# Patient Record
Sex: Male | Born: 1985 | Race: Black or African American | Hispanic: No | Marital: Married | Smoking: Never smoker
Health system: Southern US, Community
[De-identification: ages and names within clinical notes are randomized; demographics above are authoritative.]

## PROBLEM LIST (undated history)

## (undated) DIAGNOSIS — K469 Unspecified abdominal hernia without obstruction or gangrene: Secondary | ICD-10-CM

## (undated) DIAGNOSIS — I517 Cardiomegaly: Secondary | ICD-10-CM

## (undated) DIAGNOSIS — I10 Essential (primary) hypertension: Secondary | ICD-10-CM

## (undated) HISTORY — DX: Essential (primary) hypertension: I10

---

## 2007-05-31 ENCOUNTER — Emergency Department (HOSPITAL_COMMUNITY): Admission: EM | Admit: 2007-05-31 | Discharge: 2007-05-31 | Payer: Self-pay | Admitting: Emergency Medicine

## 2007-06-01 ENCOUNTER — Emergency Department (HOSPITAL_COMMUNITY): Admission: EM | Admit: 2007-06-01 | Discharge: 2007-06-01 | Payer: Self-pay | Admitting: Family Medicine

## 2007-06-10 ENCOUNTER — Emergency Department (HOSPITAL_COMMUNITY): Admission: EM | Admit: 2007-06-10 | Discharge: 2007-06-11 | Payer: Self-pay | Admitting: Emergency Medicine

## 2009-12-08 ENCOUNTER — Emergency Department (HOSPITAL_COMMUNITY): Admission: EM | Admit: 2009-12-08 | Discharge: 2009-12-08 | Payer: Self-pay | Admitting: Emergency Medicine

## 2010-04-16 ENCOUNTER — Emergency Department (HOSPITAL_COMMUNITY): Admission: EM | Admit: 2010-04-16 | Discharge: 2010-04-16 | Payer: Self-pay | Admitting: Emergency Medicine

## 2013-03-06 ENCOUNTER — Ambulatory Visit (INDEPENDENT_AMBULATORY_CARE_PROVIDER_SITE_OTHER): Payer: BC Managed Care – PPO | Admitting: Internal Medicine

## 2013-03-06 ENCOUNTER — Ambulatory Visit: Payer: BC Managed Care – PPO

## 2013-03-06 VITALS — BP 120/80 | HR 72 | Temp 98.6°F | Resp 18 | Ht 71.0 in | Wt 221.0 lb

## 2013-03-06 DIAGNOSIS — S93421A Sprain of deltoid ligament of right ankle, initial encounter: Secondary | ICD-10-CM

## 2013-03-06 DIAGNOSIS — M25571 Pain in right ankle and joints of right foot: Secondary | ICD-10-CM

## 2013-03-06 DIAGNOSIS — M25579 Pain in unspecified ankle and joints of unspecified foot: Secondary | ICD-10-CM

## 2013-03-06 DIAGNOSIS — I10 Essential (primary) hypertension: Secondary | ICD-10-CM

## 2013-03-06 DIAGNOSIS — S93429A Sprain of deltoid ligament of unspecified ankle, initial encounter: Secondary | ICD-10-CM

## 2013-03-06 NOTE — Progress Notes (Signed)
  Subjective:    Patient ID: Jake Hoffman, male    DOB: 05/14/1986, 27 y.o.   MRN: [id number]  HPI Was lifting weights with his legs in the gym this morning he felt a pop in his right ankle Has had increasing pain in the medial aspect as the day goes on Now hurts to walk No prior injuries  On HCTZ/lisinopril for hypertension  Review of Systems Noncontributory    Objective:   Physical Exam BP 120/80  Pulse 72  Temp(Src) 98.6 F (37 C) (Oral)  Resp 18  Ht 5\' 11"  (1.803 m)  Wt 221 lb (100.245 kg)  BMI 30.84 kg/m2  SpO2 99% No acute distress Alert and oriented Right ankle has minimal swelling over the medial aspect of the joint He is very tender to palpation in the posterior deltoid and the tip of the tibia He has pain with range of motion including drawer inversion and eversion  [facility] reading (PRIMARY) by  Dr.Uzoma Vivona=no fracture       Assessment & Plan:  Ankle sprain medial malleolus Pain ankle  [employer] activity as tolerated Note for work (moving company)

## 2013-05-05 ENCOUNTER — Telehealth: Payer: Self-pay | Admitting: Family

## 2013-05-05 ENCOUNTER — Encounter: Payer: Self-pay | Admitting: Family

## 2013-05-05 ENCOUNTER — Ambulatory Visit (INDEPENDENT_AMBULATORY_CARE_PROVIDER_SITE_OTHER): Payer: BC Managed Care – PPO | Admitting: Family

## 2013-05-05 VITALS — BP 118/70 | HR 84 | Ht 71.0 in | Wt 220.0 lb

## 2013-05-05 DIAGNOSIS — M25511 Pain in right shoulder: Secondary | ICD-10-CM

## 2013-05-05 DIAGNOSIS — I10 Essential (primary) hypertension: Secondary | ICD-10-CM

## 2013-05-05 DIAGNOSIS — Z23 Encounter for immunization: Secondary | ICD-10-CM

## 2013-05-05 DIAGNOSIS — M545 Low back pain: Secondary | ICD-10-CM

## 2013-05-05 DIAGNOSIS — M25519 Pain in unspecified shoulder: Secondary | ICD-10-CM

## 2013-05-05 LAB — CBC WITH DIFFERENTIAL/PLATELET
Basophils Relative: 0.5 % (ref 0.0–3.0)
Hemoglobin: 15.7 g/dL (ref 13.0–17.0)
Lymphs Abs: 2.3 10*3/uL (ref 0.7–4.0)
MCHC: 34.2 g/dL (ref 30.0–36.0)
MCV: 97.5 fl (ref 78.0–100.0)
Neutro Abs: 2.3 10*3/uL (ref 1.4–7.7)
Platelets: 268 10*3/uL (ref 150.0–400.0)
RBC: 4.7 Mil/uL (ref 4.22–5.81)
RDW: 11.9 % (ref 11.5–14.6)
WBC: 5.1 10*3/uL (ref 4.5–10.5)

## 2013-05-05 LAB — LIPID PANEL
Cholesterol: 174 mg/dL (ref 0–200)
Triglycerides: 32 mg/dL (ref 0.0–149.0)

## 2013-05-05 LAB — HEPATIC FUNCTION PANEL
AST: 34 U/L (ref 0–37)
Alkaline Phosphatase: 53 U/L (ref 39–117)
Total Bilirubin: 0.9 mg/dL (ref 0.3–1.2)

## 2013-05-05 LAB — BASIC METABOLIC PANEL
BUN: 12 mg/dL (ref 6–23)
CO2: 28 mEq/L (ref 19–32)
Calcium: 9.4 mg/dL (ref 8.4–10.5)
Creatinine, Ser: 1.2 mg/dL (ref 0.4–1.5)
GFR: 94.81 mL/min (ref >60.00)

## 2013-05-05 LAB — POCT URINALYSIS DIPSTICK: Urobilinogen, UA: 0.2

## 2013-05-05 MED ORDER — MELOXICAM 15 MG PO TABS: 15.0000 mg | ORAL_TABLET | Freq: Every day | ORAL | Status: DC

## 2013-05-05 MED ORDER — LISINOPRIL-HYDROCHLOROTHIAZIDE 10-12.5 MG PO TABS: 1.0000 | ORAL_TABLET | Freq: Every day | ORAL | Status: DC

## 2013-05-05 NOTE — Telephone Encounter (Signed)
Rx resent for 90 day supply. 

## 2013-05-05 NOTE — Telephone Encounter (Signed)
Pharmacy called and stated that the pt's insurance requires a 3 month supply of his lisinopril-hydrochlorothiazide (PRINZIDE,ZESTORETIC) 10-12.5 MG per tablet. Please assist.

## 2013-05-05 NOTE — Patient Instructions (Addendum)
Rotator Cuff Tendonitis  The rotator cuff is the collection of all the muscles and tendons (the supraspinatus, infraspinatus, subscapularis, and teres minor muscles and their tendons) that help your shoulder stay in place. This unit holds the head of the upper arm bone (humerus) in the cup (fossa) of the shoulder blade (scapula). Basically, it connects the arm to the shoulder. Tendinitis is a swelling and irritation of the tissue, called cord like structures (tendons) that connect muscle to bone. It usually is caused by overusing the joint involved. When the tissue surrounding a tendon (the synovium) becomes inflamed, it is called tenosynovitis. This also is often the result of overuse in people whose jobs require repetitive (over and over again) types of motion. HOME CARE INSTRUCTIONS   Use a sling or splint for as long as directed by your caregiver until the pain decreases.  Apply ice to the injury for 15-20 minutes, 3-4 times per day. Put the ice in a plastic bag and place a towel between the bag of ice and your skin.  Try to avoid use other than gentle range of motion while your shoulder is painful. Use and exercise only as directed by your caregiver. Stop exercises or range of motion if pain or discomfort increases, unless directed otherwise by your caregiver.  Only take over-the-counter or prescription medicines for pain, discomfort, or fever as directed by your caregiver.  If you were give a shoulder sling and straps (immobilizer), do not remove it except as directed, or until you see a caregiver for a follow-up examination. If you need to remove it, move your arm as little as possible or as directed.  You may want to sleep on several pillows at night to lessen swelling and pain. SEEK IMMEDIATE MEDICAL CARE IF:   Pain in your shoulder increases or new pain develops in your arm, hand, or fingers and is not relieved with medications.  You develop new, unexplained symptoms, especially  increased numbness in the hands or loss of strength, or you develop any worsening of the problems which brought you in for care.  Your arm, hand, or fingers are numb or tingling.  Your arm, hand, or fingers are swollen, painful, or turn white or blue. Document Released: 10/17/2003 Document Revised: 10/19/2011 Document Reviewed: 05/24/2008 Exit[employer] Patient Information 2014 [employer].  

## 2013-05-05 NOTE — Progress Notes (Signed)
Subjective:    Patient ID: Jake Hoffman, male    DOB: [birth year], 27 y.o.   MRN: [id number]  HPI  A 27 year old [place] American male, new patient to the practice and to be established. He has a history of hypertension and currently takes lisinopril HCT once daily and tolerates it well. Mother and father both have hypertension.  Patient has concerns of right shoulder pain that he rates at 10 out of 10, waxing and waning. He describes it as an achy sharp pain that typically last 3-4 weeks to resolve anti-inflammatory medications. However, after his most recent flare, it took about 2 months actually heal. He seen orthopedics in the past has had massage therapy and physical therapy they have all been ineffective. He has a known rotator cuff tear. He is a Scientist, forensic by trade.  Also complains of low back pain that he rates as 5-6/10, worse with movement when it flares. Aleve helps.  Review of Systems  Constitutional: Negative.   HENT: Negative.   Respiratory: Negative.   Cardiovascular: Negative.   Gastrointestinal: Negative.   Musculoskeletal: Positive for back pain and arthralgias.       Right shoulder and back pain  Skin: Negative.   Neurological: Negative.  Negative for weakness and light-headedness.  Hematological: Negative.   Psychiatric/Behavioral: Negative.    Past Medical History  Diagnosis Date  . Hypertension     History   Social History  . Marital Status: Single    Spouse Name: N/A    Number of Children: N/A  . Years of Education: N/A   Occupational History  . Not on file.   Social History Main Topics  . Smoking status: Never Smoker   . Smokeless tobacco: Not on file  . Alcohol Use: Yes  . Drug Use: No  . Sexual Activity: Not on file   Other Topics Concern  . Not on file   Social History Narrative  . No narrative on file    History reviewed. No pertinent past surgical history.  Family History  Problem Relation Age of Onset  . Hyperlipidemia Mother    . Hypertension Mother   . Arthritis Mother   . Hypertension Father     No Known Allergies  No current outpatient prescriptions on file prior to visit.   No current facility-administered medications on file prior to visit.    BP 118/70  Pulse 84  Ht 5\' 11"  (1.803 m)  Wt 220 lb (99.791 kg)  BMI 30.7 kg/m2chart    Objective:   Physical Exam  Constitutional: He is oriented to person, place, and time. He appears well-developed and well-nourished.  HENT:  Right Ear: External ear normal.  Left Ear: External ear normal.  Nose: Nose normal.  Mouth/Throat: Oropharynx is clear and moist.  Neck: Normal range of motion. Neck supple. No thyromegaly present.  Cardiovascular: Normal rate, regular rhythm and normal heart sounds.   Pulmonary/Chest: Effort normal and breath sounds normal.  Abdominal: Soft. Bowel sounds are normal.  Musculoskeletal: He exhibits tenderness. He exhibits no edema.  Right shoulder: Tender to palpation of the anterior right shoulder. Positive empty can test. Pain with forced flexion and extension. Pulses radial 2/2  Neurological: He is alert and oriented to person, place, and time.  Skin: Skin is warm and dry.  Psychiatric: He has a normal mood and affect.          Assessment & Plan:  Assessment: 1. Hypertension 2. Right shoulder pain 3. Rotator cuff tear 4. Low back  pain  Plan: Meloxicam 15 mg once daily with food. Continue lisinopril HCT. Return for complete physical exam. Patient does not want to explore any surgical intervention at this time. I have advised that if he comes open to that idea to refer him back to orthopedics. Consider joint injections. Continue current medications.

## 2013-05-16 ENCOUNTER — Encounter: Payer: BC Managed Care – PPO | Admitting: Family

## 2013-05-22 ENCOUNTER — Encounter: Payer: BC Managed Care – PPO | Admitting: Family

## 2013-07-03 ENCOUNTER — Ambulatory Visit: Payer: BC Managed Care – PPO | Admitting: Family

## 2013-07-11 ENCOUNTER — Telehealth: Payer: Self-pay | Admitting: Family

## 2013-07-11 ENCOUNTER — Ambulatory Visit (INDEPENDENT_AMBULATORY_CARE_PROVIDER_SITE_OTHER): Payer: BC Managed Care – PPO | Admitting: Family

## 2013-07-11 ENCOUNTER — Encounter: Payer: Self-pay | Admitting: Family

## 2013-07-11 VITALS — BP 142/100 | HR 80 | Wt 219.0 lb

## 2013-07-11 DIAGNOSIS — R9431 Abnormal electrocardiogram [ECG] [EKG]: Secondary | ICD-10-CM

## 2013-07-11 DIAGNOSIS — I10 Essential (primary) hypertension: Secondary | ICD-10-CM

## 2013-07-11 MED ORDER — LISINOPRIL-HYDROCHLOROTHIAZIDE 10-12.5 MG PO TABS: 1.0000 | ORAL_TABLET | Freq: Every day | ORAL | Status: DC

## 2013-07-11 NOTE — Patient Instructions (Signed)
1. See Cardiology

## 2013-07-11 NOTE — Telephone Encounter (Signed)
Pt would like refill of lisinopril-hydrochlorothiazide (PRINZIDE,ZESTORETIC) 10-12.5 MG per tablet  90 day  1 x day [facility]

## 2013-07-11 NOTE — Progress Notes (Signed)
   Subjective:    Patient ID: Jake Hoffman, male    DOB: May 19, 1986, 27 y.o.   MRN: [id number]  HPI 27 year old African American male, nonsmoker, with a history of hypertension is in today after his surgery was canceled by orthopedic surgery. He was scheduled to have a rotator cuff repair. He was found to have an abnormal EKG and elevated blood pressure. Patient admits that he had not taken his blood pressure in approximately 3 days. Has a family history significant for cardiovascular disease in his mother and father. He denies any lightheadedness, dizziness, chest pain, palpitations, shortness of breath or edema. EKG showed nonspecific ST changes and borderline LVH.   Review of Systems  Constitutional: Negative.   HENT: Negative.   Respiratory: Negative.   Cardiovascular: Negative.   Gastrointestinal: Negative.   Endocrine: Negative.   Genitourinary: Negative.   Musculoskeletal: Negative.   Skin: Negative.   Allergic/Immunologic: Negative.   Neurological: Negative.   Hematological: Negative.   Psychiatric/Behavioral: Negative.    Past Medical History  Diagnosis Date  . Hypertension     History   Social History  . Marital Status: Single    Spouse Name: N/A    Number of Children: N/A  . Years of Education: N/A   Occupational History  . Not on file.   Social History Main Topics  . Smoking status: Never Smoker   . Smokeless tobacco: Not on file  . Alcohol Use: Yes  . Drug Use: No  . Sexual Activity: Not on file   Other Topics Concern  . Not on file   Social History Narrative  . No narrative on file    History reviewed. No pertinent past surgical history.  Family History  Problem Relation Age of Onset  . Hyperlipidemia Mother   . Hypertension Mother   . Arthritis Mother   . Hypertension Father     No Known Allergies  Current Outpatient Prescriptions on File Prior to Visit  Medication Sig Dispense Refill  . lisinopril-hydrochlorothiazide  (PRINZIDE,ZESTORETIC) 10-12.5 MG per tablet Take 1 tablet by mouth daily.  90 tablet  1  . meloxicam (MOBIC) 15 MG tablet Take 1 tablet (15 mg total) by mouth daily.  30 tablet  5  . Omega-3 Fatty Acids (FISH OIL) 500 MG CAPS Take by mouth.       No current facility-administered medications on file prior to visit.    BP 142/100  Pulse 80  Wt 219 lb (99.338 kg)chart    Objective:   Physical Exam  Constitutional: He is oriented to person, place, and time. He appears well-developed and well-nourished.  HENT:  Right Ear: External ear normal.  Left Ear: External ear normal.  Nose: Nose normal.  Mouth/Throat: Oropharynx is clear and moist.  Neck: Normal range of motion. Neck supple.  Cardiovascular: Normal rate, regular rhythm and normal heart sounds.   Pulmonary/Chest: Effort normal and breath sounds normal.  Abdominal: Soft. Bowel sounds are normal.  Musculoskeletal: Normal range of motion.  Neurological: He is alert and oriented to person, place, and time.  Skin: Skin is warm and dry.  Psychiatric: He has a normal mood and affect.          Assessment & Plan:  Assessment: 1. Hypertension 2. Abnormal EKG  Plan: Referred to cardiology. Encouraged medication compliance. Encouraged healthy diet.

## 2013-07-11 NOTE — Telephone Encounter (Signed)
The surgical center scheduled his cardiology appt for thurs. Pt unsure who this is with. So no need to do this referral.

## 2013-07-11 NOTE — Telephone Encounter (Signed)
Done and pt aware

## 2013-07-13 ENCOUNTER — Ambulatory Visit (INDEPENDENT_AMBULATORY_CARE_PROVIDER_SITE_OTHER): Payer: BC Managed Care – PPO | Admitting: Cardiovascular Disease

## 2013-07-13 ENCOUNTER — Encounter: Payer: Self-pay | Admitting: Cardiovascular Disease

## 2013-07-13 VITALS — BP 118/80 | HR 68 | Ht 71.0 in | Wt 218.1 lb

## 2013-07-13 DIAGNOSIS — I10 Essential (primary) hypertension: Secondary | ICD-10-CM

## 2013-07-13 DIAGNOSIS — R9431 Abnormal electrocardiogram [ECG] [EKG]: Secondary | ICD-10-CM

## 2013-07-13 NOTE — Progress Notes (Signed)
     Jake Hoffman Date of Birth  Aug 20, 1985       [place] Jake Hoffman    [facility] [address]  [address] [place], [place]  [address]   [place], [place]  [address] [phone]     [phone]   Fax  [phone]    Fax [phone]  Problem List: 1. Hypertension 2. Abnormal EKG  History of Present Illness:  Jake Hoffman is a 27 yo BM with a history of hypertension. He was recently scheduled have rotator cuff surgery. The surgery was canceled when he presented to surgery and was found to have an abnormal EKG. His blood pressure was also elevated at the time.  Jake Hoffman is very healthy.  he currently works as a Civil Service fast streamer. He also works out at [employer] quite often.  He denies any episodes of chest pain or shortness breath. No episodes of syncope.  There is a strong family history of hypertension. There is no family history of sudden cardiac death.  Current Outpatient Prescriptions on File Prior to Visit  Medication Sig Dispense Refill  . lisinopril-hydrochlorothiazide (PRINZIDE,ZESTORETIC) 10-12.5 MG per tablet Take 1 tablet by mouth daily.  90 tablet  0  . Omega-3 Fatty Acids (FISH OIL) 500 MG CAPS Take by mouth.       No current facility-administered medications on file prior to visit.    No Known Allergies  Past Medical History  Diagnosis Date  . Hypertension     No past surgical history on file.  History  Smoking status  . Never Smoker   Smokeless tobacco  . Not on file    History  Alcohol Use  . Yes    Family History  Problem Relation Age of Onset  . Hyperlipidemia Mother   . Hypertension Mother   . Arthritis Mother   . Hypertension Father     Reviw of Systems:  Reviewed in the HPI.  All other systems are negative.  Physical Exam: Blood pressure 118/80, pulse 68, height 5\' 11"  (1.803 m), weight 218 lb 1.9 oz (98.939 kg). General: Well developed, well nourished, in no acute distress.  Head:  Normocephalic, atraumatic, sclera non-icteric, mucus membranes are moist,   Neck: Supple. Carotids are 2 + without bruits. No JVD   Lungs: Clear   Heart: RR, no murmur  Abdomen: Soft, non-tender, non-distended with normal bowel sounds.  Msk:  Strength and tone are normal   Extremities: No clubbing or cyanosis. No edema.  Distal pedal pulses are 2+ and equal    Neuro: CN II - XII intact.  Alert and oriented X 3.   Psych:  Normal   ECG: Dec. 2, 2014;  NSR at 79.  TWI in the inferior and lateral leads.  No old tracing to compare.   Assessment / Plan:

## 2013-07-13 NOTE — Patient Instructions (Signed)
Your physician has requested that you have an echocardiogram please see if it can be done tomorrow/ pt has surgery. Echocardiography is a painless test that uses sound waves to create images of your heart. It provides your doctor with information about the size and shape of your heart and how well your heart's chambers and valves are working. This procedure takes approximately one hour. There are no restrictions for this procedure.   Your physician recommends that you schedule a follow-up appointment in: as needed basis

## 2013-07-13 NOTE — Assessment & Plan Note (Signed)
Jake Hoffman presents for further evaluation and preoperative evaluation because of his hypertension and abnormal EKG.  He is a very healthy young gentleman. He has a long history of hypertension. He admits to not taking his medications all the time. When he showed up for surgery, his blood pressure was elevated and he was noted to have an abnormal EKG.  I suspect that he has left ventricular hypertrophy as a cause for his T-wave inversions. He's not had any episodes of angina and works out vigorously a regular basis. 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  His blood pressure is normal today.  His cardiac exam is normal. There is no significant murmur. He has no history of sudden cardiac death.  We'll get an echocardiogram for further evaluation of his left ventricular size and function.  I assume that we'll find left ventricular hypertrophy. If the echocardiogram is otherwise normal and he should be at low risk for his upcoming shoulder surgery.  I'll see him on an as-needed basis.

## 2013-07-14 ENCOUNTER — Encounter: Payer: Self-pay | Admitting: Cardiovascular Disease

## 2013-07-14 ENCOUNTER — Ambulatory Visit (HOSPITAL_COMMUNITY): Payer: BC Managed Care – PPO | Attending: Cardiovascular Disease | Admitting: Radiology

## 2013-07-14 DIAGNOSIS — R9431 Abnormal electrocardiogram [ECG] [EKG]: Secondary | ICD-10-CM | POA: Insufficient documentation

## 2013-07-14 DIAGNOSIS — I10 Essential (primary) hypertension: Secondary | ICD-10-CM

## 2013-07-14 NOTE — Progress Notes (Signed)
Echocardiogram performed.  

## 2013-07-17 ENCOUNTER — Telehealth: Payer: Self-pay | Admitting: *Deleted

## 2013-07-17 NOTE — Telephone Encounter (Signed)
Pt verbalized understanding.

## 2013-07-17 NOTE — Telephone Encounter (Signed)
Message copied by Antony Odea on Mon Jul 17, 2013  1:46 PM ------      Message from: Vesta Mixer      Created: Sun Jul 16, 2013  6:32 AM       The echo shows normal LV function.  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  He is at low risk for his upcoming shoulder surgery. ------

## 2013-07-18 ENCOUNTER — Telehealth: Payer: Self-pay

## 2013-07-18 MED ORDER — METHYLPREDNISOLONE 4 MG PO KIT: PACK | ORAL | Status: DC

## 2013-07-18 NOTE — Telephone Encounter (Signed)
Pt c/o cough, congestion, yellow/green mucus. Has been exposed to bronchitis by 2 people.  Per Oran Rein, Medrol dosepak to pharmacy. If sxs worsen or persist pt needs to be seen

## 2013-07-19 ENCOUNTER — Ambulatory Visit (INDEPENDENT_AMBULATORY_CARE_PROVIDER_SITE_OTHER): Payer: BC Managed Care – PPO | Admitting: Family

## 2013-07-19 ENCOUNTER — Encounter: Payer: Self-pay | Admitting: Family

## 2013-07-19 VITALS — BP 124/84 | Temp 98.1°F | Wt 222.0 lb

## 2013-07-19 DIAGNOSIS — R05 Cough: Secondary | ICD-10-CM

## 2013-07-19 DIAGNOSIS — J069 Acute upper respiratory infection, unspecified: Secondary | ICD-10-CM

## 2013-07-19 NOTE — Progress Notes (Signed)
Pre visit review using our clinic review tool, if applicable. No additional management support is needed unless otherwise documented below in the visit note. 

## 2013-07-19 NOTE — Progress Notes (Signed)
   Subjective:    Patient ID: Jake Hoffman, male    DOB: 08-27-1985, 27 y.o.   MRN: [id number]  HPI 27 year old African American male, nonsmoker presents today with complaints of cough, chest congestion x3 days. Yesterday we called in prednisone for him and today he feels better. However, he wants to come in just to be checked. Is taking Mucinex and Delsym over-the-counter that has helped. His son has been sick as well.   Review of Systems  Constitutional: Negative.   HENT: Positive for congestion.   Respiratory: Positive for cough.   Cardiovascular: Negative.   Skin: Negative.   Allergic/Immunologic: Negative.   Neurological: Negative.   Psychiatric/Behavioral: Negative.    Past Medical History  Diagnosis Date  . Hypertension     History   Social History  . Marital Status: Single    Spouse Name: N/A    Number of Children: N/A  . Years of Education: N/A   Occupational History  . Not on file.   Social History Main Topics  . Smoking status: Never Smoker   . Smokeless tobacco: Not on file  . Alcohol Use: Yes  . Drug Use: No  . Sexual Activity: Not on file   Other Topics Concern  . Not on file   Social History Narrative  . No narrative on file    History reviewed. No pertinent past surgical history.  Family History  Problem Relation Age of Onset  . Hyperlipidemia Mother   . Hypertension Mother   . Arthritis Mother   . Hypertension Father     No Known Allergies  Current Outpatient Prescriptions on File Prior to Visit  Medication Sig Dispense Refill  . lisinopril-hydrochlorothiazide (PRINZIDE,ZESTORETIC) 10-12.5 MG per tablet Take 1 tablet by mouth daily.  90 tablet  0  . methylPREDNISolone (MEDROL DOSEPAK) 4 MG tablet follow package directions  21 tablet  0  . Omega-3 Fatty Acids (FISH OIL) 500 MG CAPS Take by mouth.       No current facility-administered medications on file prior to visit.    BP 124/84  Temp(Src) 98.1 F (36.7 C) (Oral)  Wt 222  lb (100.699 kg)chart    Objective:   Physical Exam  Constitutional: He is oriented to person, place, and time. He appears well-developed and well-nourished.  HENT:  Right Ear: External ear normal.  Left Ear: External ear normal.  Nose: Nose normal.  Mouth/Throat: Oropharynx is clear and moist.  Neck: Normal range of motion. Neck supple.  Cardiovascular: Normal rate, regular rhythm and normal heart sounds.   Pulmonary/Chest: Effort normal and breath sounds normal. He has no wheezes.  Neurological: He is alert and oriented to person, place, and time.  Skin: Skin is warm and dry.  Psychiatric: He has a normal mood and affect.          Assessment & Plan:  Assessment: 1. Upper respiratory infection 2. Cough  Plan: Continue Medrol Dosepak as directed. Continue over-the-counter symptomatic treatment for relief. Patient by the office if symptoms worsen or persist. Recheck as scheduled, and as needed.

## 2013-07-19 NOTE — Progress Notes (Signed)
   Subjective:    Patient ID: Jake Hoffman, male    DOB: [birth year], 27 y.o.   MRN: [id number]  HPI 27 year old AA male, nonsmoker, presenting with cough x 3 weeks.  Has tried several OTC medications that include Mucinex, Delsyn, Aleve, Tessalon pearls, Flonase, and Hycodan syrup.  Received prescription yesterday for prednisone.  Complains of runny nose, nasal congestion, cough, and pressure in ears.      Review of Systems  Constitutional: Negative.   HENT: Positive for congestion, ear pain and postnasal drip.        Patient complains of nasal congestion, runny nose, and pressure in ears  Eyes: Negative.   Respiratory: Positive for cough.        Patient complains of cough  Cardiovascular: Negative.   Gastrointestinal: Negative.   Endocrine: Negative.   Genitourinary: Negative.   Musculoskeletal: Negative.   Skin: Negative.   Allergic/Immunologic: Negative.   Neurological: Negative.   Hematological: Negative.   Psychiatric/Behavioral: Negative.        Objective:   Physical Exam  Constitutional: He is oriented to person, place, and time. He appears well-developed and well-nourished.  HENT:  Right Ear: External ear normal.  Left Ear: External ear normal.  Patient has white fluid noted in both ears. Cough and nasal congestion noted.  Cardiovascular: Normal rate and normal heart sounds.   Pulmonary/Chest: Effort normal and breath sounds normal.  Neurological: He is alert and oriented to person, place, and time.  Skin: Skin is warm and dry.          Assessment & Plan:  Assessment  Upper Respiratory Infection  Plan Continue on Medrol Dosepak and Delsyn.  Drink plenty of fluids.  Practice good hand washing.  Call office if symptoms persist or worsen.

## 2013-07-20 ENCOUNTER — Other Ambulatory Visit (HOSPITAL_COMMUNITY): Payer: BC Managed Care – PPO

## 2013-07-20 NOTE — Patient Instructions (Signed)

## 2013-08-08 ENCOUNTER — Ambulatory Visit: Payer: BC Managed Care – PPO | Admitting: Family

## 2013-09-18 ENCOUNTER — Encounter: Payer: Self-pay | Admitting: Family

## 2013-09-18 ENCOUNTER — Ambulatory Visit (INDEPENDENT_AMBULATORY_CARE_PROVIDER_SITE_OTHER): Payer: BC Managed Care – PPO | Admitting: Family

## 2013-09-18 VITALS — BP 122/62 | HR 90 | Ht 71.0 in | Wt 216.0 lb

## 2013-09-18 DIAGNOSIS — Z0000 Encounter for general adult medical examination without abnormal findings: Secondary | ICD-10-CM

## 2013-09-18 DIAGNOSIS — I10 Essential (primary) hypertension: Secondary | ICD-10-CM

## 2013-09-18 MED ORDER — LISINOPRIL-HYDROCHLOROTHIAZIDE 10-12.5 MG PO TABS: 1.0000 | ORAL_TABLET | Freq: Every day | ORAL | Status: DC

## 2013-09-18 NOTE — Progress Notes (Signed)
   Subjective:    Patient ID: Jake Hoffman, male    DOB: Aug 16, 1985, 28 y.o.   MRN: [id number]  HPI. 28 year old AA male, nonsmoker, presents for annual physical examination. All immunizations and health maintenance protocols were reviewed with the patient and they are up to date with these protocols. Goals were established with regard to weight loss exercise diet in compliance with medications.  He misses several doses during the week. He runs two miles a day.    Review of Systems  Constitutional: Negative.   HENT: Negative.   Eyes: Negative.   Respiratory: Negative.   Cardiovascular: Negative.   Gastrointestinal: Negative.   Endocrine: Negative.   Genitourinary: Negative.   Musculoskeletal: Negative.   Skin: Negative.   Allergic/Immunologic: Negative.   Neurological: Negative.   Hematological: Negative.   Psychiatric/Behavioral: Negative.    Past Medical History  Diagnosis Date  . Hypertension     History   Social History  . Marital Status: Single    Spouse Name: N/A    Number of Children: N/A  . Years of Education: N/A   Occupational History  . Not on file.   Social History Main Topics  . Smoking status: Never Smoker   . Smokeless tobacco: Not on file  . Alcohol Use: Yes  . Drug Use: No  . Sexual Activity: Not on file   Other Topics Concern  . Not on file   Social History Narrative  . No narrative on file    History reviewed. No pertinent past surgical history.  Family History  Problem Relation Age of Onset  . Hyperlipidemia Mother   . Hypertension Mother   . Arthritis Mother   . Hypertension Father     No Known Allergies  Current Outpatient Prescriptions on File Prior to Visit  Medication Sig Dispense Refill  . Omega-3 Fatty Acids (FISH OIL) 500 MG CAPS Take by mouth.       No current facility-administered medications on file prior to visit.    BP 122/62  Pulse 90  Ht 5\' 11"  (1.803 m)  Wt 216 lb (97.977 kg)  BMI 30.14  kg/m2chart     Objective:   Physical Exam  Constitutional: He is oriented to person, place, and time. He appears well-developed and well-nourished.  HENT:  Head: Normocephalic.  Right Ear: External ear normal.  Left Ear: External ear normal.  Eyes: Pupils are equal, round, and reactive to light.  Neck: Normal range of motion. Neck supple.  Cardiovascular: Normal rate, regular rhythm and normal heart sounds.   Pulmonary/Chest: Effort normal and breath sounds normal.  Abdominal: Soft. Bowel sounds are normal.  Musculoskeletal: Normal range of motion.  Neurological: He is alert and oriented to person, place, and time.  Skin: Skin is warm and dry.  Psychiatric: He has a normal mood and affect.          Assessment & Plan:  Assessment 1. Annual Physical Examination 2. Hypertension  Plan 1. Continue current medication. 2. Encourage low sodium diet.. 3. Educate about the importanceof taking his blood pressure medication as scheduled. 4. Refill on medication.

## 2013-09-18 NOTE — Patient Instructions (Signed)

## 2013-09-18 NOTE — Progress Notes (Signed)
Pre visit review using our clinic review tool, if applicable. No additional management support is needed unless otherwise documented below in the visit note. 

## 2013-09-19 ENCOUNTER — Telehealth: Payer: Self-pay | Admitting: Family

## 2013-09-19 NOTE — Telephone Encounter (Signed)
Relevant patient education assigned to patient using Emmi. ° °

## 2013-10-20 ENCOUNTER — Emergency Department (HOSPITAL_COMMUNITY)
Admission: EM | Admit: 2013-10-20 | Discharge: 2013-10-20 | Disposition: A | Discharge status: Home / Self Care | Payer: BC Managed Care – PPO | Attending: Emergency Medicine | Admitting: Emergency Medicine

## 2013-10-20 ENCOUNTER — Encounter (HOSPITAL_COMMUNITY): Payer: Self-pay | Admitting: Emergency Medicine

## 2013-10-20 ENCOUNTER — Emergency Department (HOSPITAL_COMMUNITY): Payer: BC Managed Care – PPO

## 2013-10-20 DIAGNOSIS — M545 Low back pain, unspecified: Secondary | ICD-10-CM

## 2013-10-20 DIAGNOSIS — Y92410 Unspecified street and highway as the place of occurrence of the external cause: Secondary | ICD-10-CM | POA: Insufficient documentation

## 2013-10-20 DIAGNOSIS — I10 Essential (primary) hypertension: Secondary | ICD-10-CM | POA: Insufficient documentation

## 2013-10-20 DIAGNOSIS — Z79899 Other long term (current) drug therapy: Secondary | ICD-10-CM | POA: Insufficient documentation

## 2013-10-20 DIAGNOSIS — IMO0002 Reserved for concepts with insufficient information to code with codable children: Secondary | ICD-10-CM | POA: Insufficient documentation

## 2013-10-20 DIAGNOSIS — Y9389 Activity, other specified: Secondary | ICD-10-CM | POA: Insufficient documentation

## 2013-10-20 MED ORDER — IBUPROFEN 800 MG PO TABS
800.0000 mg | ORAL_TABLET | Freq: Once | ORAL | Status: AC
  Administered 2013-10-20: 800 mg via ORAL
  Filled 2013-10-20: qty 1

## 2013-10-20 MED ORDER — MELOXICAM 7.5 MG PO TABS: 15.0000 mg | ORAL_TABLET | Freq: Every day | ORAL | Status: DC

## 2013-10-20 MED ORDER — METHOCARBAMOL 500 MG PO TABS: 500.0000 mg | ORAL_TABLET | Freq: Two times a day (BID) | ORAL | Status: DC
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
## 2013-10-20 NOTE — ED Provider Notes (Signed)
Medical screening examination/treatment/procedure(s) were performed by non-physician practitioner and as supervising physician I was immediately available for consultation/collaboration.   EKG Interpretation None       Raeford RazorStephen Arnetha Silverthorne, MD 10/20/13 2359

## 2013-10-20 NOTE — ED Notes (Signed)
Patient transported to X-ray 
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
## 2013-10-20 NOTE — Discharge Instructions (Signed)
Recommend Mobic and Robaxin for symptoms. Also apply ice to your back 3-4 times per day for the next 72 hours. Apply ice for at least 30 minutes each time. Refrain from strenuous activity or heavy lifting for the next 72 hours. Followup with your primary care provider on Monday.  Back Pain, Adult Low back pain is very common. About 1 in 5 people have back pain.The cause of low back pain is rarely dangerous. The pain often gets better over time.About half of people with a sudden onset of back pain feel better in just 2 weeks. About 8 in 10 people feel better by 6 weeks.  CAUSES Some common causes of back pain include:  Strain of the muscles or ligaments supporting the spine.  Wear and tear (degeneration) of the spinal discs.  Arthritis.  Direct injury to the back. DIAGNOSIS Most of the time, the direct cause of low back pain is not known.However, back pain can be treated effectively even when the exact cause of the pain is unknown.Answering your caregiver's questions about your overall health and symptoms is one of the most accurate ways to make sure the cause of your pain is not dangerous. If your caregiver needs more information, he or she may order lab work or imaging tests (X-rays or MRIs).However, even if imaging tests show changes in your back, this usually does not require surgery. HOME CARE INSTRUCTIONS For many people, back pain returns.Since low back pain is rarely dangerous, it is often a condition that people can learn to [facility] their own.   Remain active. It is stressful on the back to sit or stand in one place. Do not sit, drive, or stand in one place for more than 30 minutes at a time. Take short walks on level surfaces as soon as pain allows.Try to increase the length of time you walk each day.  Do not stay in bed.Resting more than 1 or 2 days can delay your recovery.  Do not avoid exercise or work.Your body is made to move.It is not dangerous to be active, even  though your back may hurt.Your back will likely heal faster if you return to being active before your pain is gone.  Pay attention to your body when you bend and lift. Many people have less discomfortwhen lifting if they bend their knees, keep the load close to their bodies,and avoid twisting. Often, the most comfortable positions are those that put less stress on your recovering back.  Find a comfortable position to sleep. Use a firm mattress and lie on your side with your knees slightly bent. If you lie on your back, put a pillow under your knees.  Only take over-the-counter or prescription medicines as directed by your caregiver. Over-the-counter medicines to reduce pain and inflammation are often the most helpful.Your caregiver may prescribe muscle relaxant drugs.These medicines help dull your pain so you can more quickly return to your normal activities and healthy exercise.  Put ice on the injured area.  Put ice in a plastic bag.  Place a towel between your skin and the bag.  Leave the ice on for 15-20 minutes, 03-04 times a day for the first 2 to 3 days. After that, ice and heat may be alternated to reduce pain and spasms.  Ask your caregiver about trying back exercises and gentle massage. This may be of some benefit.  Avoid feeling anxious or stressed.Stress increases muscle tension and can worsen back pain.It is important to recognize when you are anxious or  stressed and learn ways to manage it.Exercise is a great option. SEEK MEDICAL CARE IF:  You have pain that is not relieved with rest or medicine.  You have pain that does not improve in 1 week.  You have new symptoms.  You are generally not feeling well. SEEK IMMEDIATE MEDICAL CARE IF:   You have pain that radiates from your back into your legs.  You develop new bowel or bladder control problems.  You have unusual weakness or numbness in your arms or legs.  You develop nausea or vomiting.  You develop  abdominal pain.  You feel faint. Document Released: 07/27/2005 Document Revised: 01/26/2012 Document Reviewed: 12/15/2010 [facility] Patient Information 2014 [place]ExitCare, [place]LL[place].

## 2013-10-31 ENCOUNTER — Telehealth: Payer: Self-pay

## 2013-10-31 MED ORDER — AZITHROMYCIN 250 MG PO TABS: ORAL_TABLET | ORAL | Status: DC

## 2013-10-31 NOTE — Telephone Encounter (Signed)
Pt walked in office c/o sinus pressure and pain.   Per Padonda, have pt get zyrtec d and send in z-pak

## 2014-01-17 ENCOUNTER — Ambulatory Visit (INDEPENDENT_AMBULATORY_CARE_PROVIDER_SITE_OTHER): Payer: BC Managed Care – PPO | Admitting: Family

## 2014-01-17 ENCOUNTER — Encounter: Payer: Self-pay | Admitting: Family

## 2014-01-17 VITALS — BP 128/80 | HR 107 | Temp 98.7°F | Wt 230.0 lb

## 2014-01-17 DIAGNOSIS — I10 Essential (primary) hypertension: Secondary | ICD-10-CM

## 2014-01-17 DIAGNOSIS — E7800 Pure hypercholesterolemia, unspecified: Secondary | ICD-10-CM

## 2014-01-17 DIAGNOSIS — M436 Torticollis: Secondary | ICD-10-CM

## 2014-01-17 DIAGNOSIS — E781 Pure hyperglyceridemia: Secondary | ICD-10-CM

## 2014-01-17 MED ORDER — CYCLOBENZAPRINE HCL 10 MG PO TABS: 10.0000 mg | ORAL_TABLET | Freq: Three times a day (TID) | ORAL | Status: DC | PRN

## 2014-01-17 MED ORDER — IBUPROFEN 800 MG PO TABS: 800.0000 mg | ORAL_TABLET | Freq: Three times a day (TID) | ORAL | Status: DC | PRN

## 2014-01-17 NOTE — Patient Instructions (Signed)
Torticollis, Acute °You have suddenly (acutely) developed a twisted neck (torticollis). This is usually a self-limited condition. °CAUSES  °Acute torticollis may be caused by malposition, trauma or infection. Most commonly, acute torticollis is caused by sleeping in an awkward position. Torticollis may also be caused by the flexion, extension or twisting of the neck muscles beyond their normal position. Sometimes, the exact cause may not be known. °SYMPTOMS  °Usually, there is pain and limited movement of the neck. Your neck may twist to one side. °DIAGNOSIS  °The diagnosis is often made by physical examination. X-rays, CT scans or MRIs may be done if there is a history of trauma or concern of infection. °TREATMENT  °For a common, stiff neck that develops during sleep, treatment is focused on relaxing the contracted neck muscle. Medications (including shots) may be used to treat the problem. Most cases resolve in several days. Torticollis usually responds to conservative physical therapy. If left untreated, the shortened and spastic neck muscle can cause deformities in the face and neck. Rarely, surgery is required. °HOME CARE INSTRUCTIONS  °· Use over-the-counter and prescription medications as directed by your caregiver. °· Do stretching exercises and massage the neck as directed by your caregiver. °· Follow up with physical therapy if needed and as directed by your caregiver. °SEEK IMMEDIATE MEDICAL CARE IF:  °· You develop difficulty breathing or noisy breathing (stridor). °· You drool, develop trouble swallowing or have pain with swallowing. °· You develop numbness or weakness in the hands or feet. °· You have changes in speech or vision. °· You have problems with urination or bowel movements. °· You have difficulty walking. °· You have a fever. °· You have increased pain. °MAKE SURE YOU:  °· Understand these instructions. °· Will watch your condition. °· Will get help right away if you are not doing well or  get worse. °Document Released: 07/24/2000 Document Revised: 10/19/2011 Document Reviewed: 09/04/2009 °ExitCare® Patient Information ©2014 [employer], LLC. ° °

## 2014-01-17 NOTE — Progress Notes (Signed)
Subjective:    Patient ID: Jake Hoffman, male    DOB: July 21, 1986, 28 y.o.   MRN: [id number]  HPI 28 year old African American male, nonsmoker, with a history of hypertension hyperlipidemia is in today with complaints of neck pain that began when he awakened from sleep yesterday. Rates the pain a 10 out of 10 with movement at rest, and continue [place]. Has taken ibuprofen and that has helped. No past medical history of any injuries to the neck. Describes the pain as a dull ache.   Review of Systems  Constitutional: Negative.   Respiratory: Negative.   Cardiovascular: Negative.   Gastrointestinal: Negative.   Endocrine: Negative.   Genitourinary: Negative.   Musculoskeletal: Positive for neck pain.       Pain with flexion and rotation. No swelling  Skin: Negative.   Neurological: Negative.  Negative for headaches.  Psychiatric/Behavioral: Negative.    Past Medical History  Diagnosis Date  . Hypertension     History   Social History  . Marital Status: Single    Spouse Name: N/A    Number of Children: N/A  . Years of Education: N/A   Occupational History  . Not on file.   Social History Main Topics  . Smoking status: Never Smoker   . Smokeless tobacco: Not on file  . Alcohol Use: Yes  . Drug Use: No  . Sexual Activity: Not on file   Other Topics Concern  . Not on file   Social History Narrative  . No narrative on file    History reviewed. No pertinent past surgical history.  Family History  Problem Relation Age of Onset  . Hyperlipidemia Mother   . Hypertension Mother   . Arthritis Mother   . Hypertension Father     No Known Allergies  Current Outpatient Prescriptions on File Prior to Visit  Medication Sig Dispense Refill  . lisinopril-hydrochlorothiazide (PRINZIDE,ZESTORETIC) 10-12.5 MG per tablet Take 1 tablet by mouth daily.  90 tablet  1   No current facility-administered medications on file prior to visit.    BP 128/80  Pulse 107   Temp(Src) 98.7 F (37.1 C) (Oral)  Wt 230 lb (104.327 kg)  SpO2 97%chart    Objective:   Physical Exam  Constitutional: He is oriented to person, place, and time. He appears well-developed and well-nourished.  Neck: Neck supple.  Mild tenderness to palpation to the base of the cervical spine. Pain with flexion and rotation. No obvious swelling.  Cardiovascular: Normal rate, regular rhythm and normal heart sounds.   Pulmonary/Chest: Effort normal and breath sounds normal.  Abdominal: Soft. Bowel sounds are normal.  Musculoskeletal: Normal range of motion.  Neurological: He is alert and oriented to person, place, and time.  Skin: Skin is warm and dry.  Psychiatric: He has a normal mood and affect.          Assessment & Plan:   Problem List Items Addressed This Visit   None    Visit Diagnoses   Torticollis, acute    -  Primary    Pure hypercholesterolemia        Relevant Orders       CBC with Differential       CMP    Unspecified essential hypertension        Relevant Orders       CBC with Differential       CMP     Call the office with any questions or concerns. Recheck pending labs and  sooner as needed

## 2014-01-17 NOTE — Progress Notes (Signed)
Pre visit review using our clinic review tool, if applicable. No additional management support is needed unless otherwise documented below in the visit note. 

## 2014-01-19 ENCOUNTER — Other Ambulatory Visit: Payer: BC Managed Care – PPO

## 2014-01-24 ENCOUNTER — Other Ambulatory Visit: Payer: BC Managed Care – PPO

## 2014-01-31 ENCOUNTER — Other Ambulatory Visit (INDEPENDENT_AMBULATORY_CARE_PROVIDER_SITE_OTHER): Payer: BC Managed Care – PPO

## 2014-01-31 DIAGNOSIS — E7800 Pure hypercholesterolemia, unspecified: Secondary | ICD-10-CM

## 2014-01-31 DIAGNOSIS — I10 Essential (primary) hypertension: Secondary | ICD-10-CM

## 2014-01-31 LAB — CBC WITH DIFFERENTIAL/PLATELET
BASOS ABS: 0 10*3/uL (ref 0.0–0.1)
Basophils Relative: 0.5 % (ref 0.0–3.0)
EOS ABS: 0 10*3/uL (ref 0.0–0.7)
Eosinophils Relative: 0.9 % (ref 0.0–5.0)
HCT: 45.3 % (ref 39.0–52.0)
Hemoglobin: 15.3 g/dL (ref 13.0–17.0)
LYMPHS ABS: 2.2 10*3/uL (ref 0.7–4.0)
LYMPHS PCT: 41.6 % (ref 12.0–46.0)
MCHC: 33.8 g/dL (ref 30.0–36.0)
MCV: 99.2 fl (ref 78.0–100.0)
MONOS PCT: 8.2 % (ref 3.0–12.0)
Monocytes Absolute: 0.4 10*3/uL (ref 0.1–1.0)
NEUTROS ABS: 2.6 10*3/uL (ref 1.4–7.7)
NEUTROS PCT: 48.8 % (ref 43.0–77.0)
PLATELETS: 302 10*3/uL (ref 150.0–400.0)
RBC: 4.57 Mil/uL (ref 4.22–5.81)
RDW: 12.4 % (ref 11.5–15.5)
WBC: 5.3 10*3/uL (ref 4.0–10.5)

## 2014-01-31 LAB — COMPREHENSIVE METABOLIC PANEL WITH GFR
ALT: 31 U/L (ref 0–53)
AST: 26 U/L (ref 0–37)
Albumin: 4.4 g/dL (ref 3.5–5.2)
Alkaline Phosphatase: 58 U/L (ref 39–117)
BUN: 10 mg/dL (ref 6–23)
CO2: 31 meq/L (ref 19–32)
Calcium: 9.5 mg/dL (ref 8.4–10.5)
Chloride: 103 meq/L (ref 96–112)
Creatinine, Ser: 1.3 mg/dL (ref 0.4–1.5)
GFR: 85.09 mL/min
Glucose, Bld: 87 mg/dL (ref 70–99)
Potassium: 4.1 meq/L (ref 3.5–5.1)
Sodium: 139 meq/L (ref 135–145)
Total Bilirubin: 0.8 mg/dL (ref 0.2–1.2)
Total Protein: 7.8 g/dL (ref 6.0–8.3)

## 2014-05-11 ENCOUNTER — Other Ambulatory Visit: Payer: Self-pay | Admitting: Family

## 2014-05-11 MED ORDER — LISINOPRIL-HYDROCHLOROTHIAZIDE 20-12.5 MG PO TABS: 1.0000 | ORAL_TABLET | Freq: Every day | ORAL | Status: DC

## 2014-06-04 ENCOUNTER — Ambulatory Visit (INDEPENDENT_AMBULATORY_CARE_PROVIDER_SITE_OTHER): Payer: BC Managed Care – PPO | Admitting: Internal Medicine

## 2014-06-04 ENCOUNTER — Encounter: Payer: Self-pay | Admitting: Internal Medicine

## 2014-06-04 VITALS — BP 130/78 | Temp 98.2°F | Wt 238.9 lb

## 2014-06-04 DIAGNOSIS — R0981 Nasal congestion: Secondary | ICD-10-CM

## 2014-06-04 DIAGNOSIS — J069 Acute upper respiratory infection, unspecified: Secondary | ICD-10-CM

## 2014-06-04 DIAGNOSIS — R51 Headache: Secondary | ICD-10-CM

## 2014-06-04 DIAGNOSIS — R519 Headache, unspecified: Secondary | ICD-10-CM

## 2014-06-04 MED ORDER — AMOXICILLIN 500 MG PO CAPS: 500.0000 mg | ORAL_CAPSULE | Freq: Three times a day (TID) | ORAL | Status: DC

## 2014-06-04 NOTE — Progress Notes (Signed)
Pre visit review using our clinic review tool, if applicable. No additional management support is needed unless otherwise documented below in the visit note.   Chief Complaint  Patient presents with  . Sore Throat    Has started a new job.  Is around a lot of dust.  Has tried OTC Mucinex and Benadryl  . Headache  . Stuffy Nose  . Night Sweats    HPI: Patient Jake Hoffman  comes in today for SDA for  new problem evaluation. Onset for about 5 day ago . See above symptoms sore throat that was bad over the weekend now is getting better but has stuffy nose sweating had a cough for a day and some headache mostly on the right side. ? If could be allergies. Has a new job Marine scientistolo distrubition.   Dusty antak e benadryl in seasons. No history of asthma. Doesn't smoke  ROS: See pertinent positives and negatives per HPI. No fever has some achiness significant stuffy nose was told he had a tooth problem infection a few weeks ago and was told to make an appointment for repeat canal but hasn't done that yet. In the same side of the face that he has had the headache. Cough not bad   Past Medical History  Diagnosis Date  . Hypertension     Family History  Problem Relation Age of Onset  . Hyperlipidemia Mother   . Hypertension Mother   . Arthritis Mother   . Hypertension Father     History   Social History  . Marital Status: Single    Spouse Name: N/A    Number of Children: N/A  . Years of Education: N/A   Social History Main Topics  . Smoking status: Never Smoker   . Smokeless tobacco: None  . Alcohol Use: Yes  . Drug Use: No  . Sexual Activity: None   Other Topics Concern  . None   Social History Narrative  . None    Outpatient Encounter Prescriptions as of 06/04/2014  Medication Sig  . ibuprofen (ADVIL,MOTRIN) 800 MG tablet Take 1 tablet (800 mg total) by mouth every 8 (eight) hours as needed.  Marland Kitchen. lisinopril-hydrochlorothiazide (ZESTORETIC) 20-12.5 MG per tablet Take 1  tablet by mouth daily.  . Omega-3 Fatty Acids (FISH OIL) 1200 MG CAPS Take by mouth.  Marland Kitchen. amoxicillin (AMOXIL) 500 MG capsule Take 1 capsule (500 mg total) by mouth 3 (three) times daily.  . [DISCONTINUED] cyclobenzaprine (FLEXERIL) 10 MG tablet Take 1 tablet (10 mg total) by mouth 3 (three) times daily as needed for muscle spasms.    EXAM:  BP 130/78  Temp(Src) 98.2 F (36.8 C) (Oral)  Wt 238 lb 14.4 oz (108.364 kg)  Body mass index is 33.33 kg/(m^2).  GENERAL: vitals reviewed and listed above, alert, oriented, appears well hydrated and in no acute distress mildly congested nontoxic respirations quiet HEENT: atraumatic, conjunctiva  clear, no obvious abnormalities on inspection of external nose and ears TMs are clear eyes clear nares congested turbinates +2 ;  face not really tender OP tonsils +1 no exudate no ulcers mild erythema no obvious gum redness or lesion NECK: no obvious masses on inspection palpation no adenopathy LUNGS: clear to auscultation bilaterally, no wheezes, rales or rhonchi, good air movement CV: HRRR, no clubbing cyanosis or  peripheral edema nl cap refill  MS: moves all extremities without noticeable focal  abnormality PSYCH: pleasant and cooperative, no obvious depression or anxiety  ASSESSMENT AND PLAN:  Discussed the following  assessment and plan:  Right-sided headache - HA vs related to tooth  empiric amox and get appt  for his root canal  Nasal congestion - uri +- ? allergic phenom   URI, acute ? - ? viral  with  above consiterations  -Patient advised to return or notify health care team  if symptoms worsen ,persist or new concerns arise.  Patient Instructions  Chest is clear  Take nasal cortisone every day for at least 10 -14 days to see if helps the stuffiness. This helps nasal allergies and you can use it year round.  Rest of the symptoms seem like a viral respiratory infection that should get better on its own. In another week.   However because  of the history of the dental problem infection needing a root canal and increasing pain on the right side near your ear and throat over the weekend we will add amoxicillin for 5 days . Please make an appointment with the dental specialist as advised.   Neta MendsWanda K. Panosh M.D.

## 2014-06-04 NOTE — Patient Instructions (Signed)
Chest is clear  Take nasal cortisone every day for at least 10 -14 days to see if helps the stuffiness. This helps nasal allergies and you can use it year round.  Rest of the symptoms seem like a viral respiratory infection that should get better on its own. In another week.   However because of the history of the dental problem infection needing a root canal and increasing pain on the right side near your ear and throat over the weekend we will add amoxicillin for 5 days . Please make an appointment with the dental specialist as advised.

## 2014-06-20 ENCOUNTER — Ambulatory Visit: Payer: BC Managed Care – PPO | Admitting: Family Medicine

## 2014-07-12 ENCOUNTER — Telehealth: Payer: Self-pay | Admitting: Family

## 2014-07-12 NOTE — Telephone Encounter (Signed)
Patient called you back.. I gave him my extension and he will call back on his break at 8.  His break is from [phone].

## 2014-07-13 ENCOUNTER — Ambulatory Visit: Payer: BC Managed Care – PPO | Admitting: Family

## 2015-04-13 ENCOUNTER — Observation Stay (HOSPITAL_COMMUNITY)
Admission: EM | Admit: 2015-04-13 | Discharge: 2015-04-14 | Disposition: A | Discharge status: Home / Self Care | Payer: BLUE CROSS/BLUE SHIELD | Attending: Surgery | Admitting: Surgery

## 2015-04-13 ENCOUNTER — Emergency Department (HOSPITAL_COMMUNITY): Payer: BLUE CROSS/BLUE SHIELD

## 2015-04-13 ENCOUNTER — Encounter (HOSPITAL_COMMUNITY): Payer: Self-pay | Admitting: Emergency Medicine

## 2015-04-13 ENCOUNTER — Emergency Department (HOSPITAL_COMMUNITY): Payer: BLUE CROSS/BLUE SHIELD | Admitting: Registered Nurse

## 2015-04-13 ENCOUNTER — Ambulatory Visit: Payer: BLUE CROSS/BLUE SHIELD

## 2015-04-13 ENCOUNTER — Encounter (HOSPITAL_COMMUNITY)
Admission: EM | Disposition: A | Discharge status: Home / Self Care | Payer: Self-pay | Source: Home / Self Care | Attending: Emergency Medicine

## 2015-04-13 DIAGNOSIS — Z79899 Other long term (current) drug therapy: Secondary | ICD-10-CM | POA: Insufficient documentation

## 2015-04-13 DIAGNOSIS — I517 Cardiomegaly: Secondary | ICD-10-CM | POA: Diagnosis not present

## 2015-04-13 DIAGNOSIS — K3580 Unspecified acute appendicitis: Secondary | ICD-10-CM | POA: Diagnosis not present

## 2015-04-13 DIAGNOSIS — I10 Essential (primary) hypertension: Secondary | ICD-10-CM | POA: Diagnosis not present

## 2015-04-13 DIAGNOSIS — Z792 Long term (current) use of antibiotics: Secondary | ICD-10-CM | POA: Diagnosis not present

## 2015-04-13 DIAGNOSIS — R109 Unspecified abdominal pain: Secondary | ICD-10-CM | POA: Diagnosis present

## 2015-04-13 DIAGNOSIS — K819 Cholecystitis, unspecified: Secondary | ICD-10-CM

## 2015-04-13 HISTORY — PX: LAPAROSCOPIC APPENDECTOMY: SHX408

## 2015-04-13 HISTORY — DX: Unspecified abdominal hernia without obstruction or gangrene: K46.9

## 2015-04-13 HISTORY — DX: Cardiomegaly: I51.7

## 2015-04-13 LAB — CBC
HCT: 44.2 % (ref 39.0–52.0)
HEMOGLOBIN: 15.1 g/dL (ref 13.0–17.0)
MCH: 33.9 pg (ref 26.0–34.0)
MCHC: 34.2 g/dL (ref 30.0–36.0)
MCV: 99.1 fL (ref 78.0–100.0)
Platelets: 266 10*3/uL (ref 150–400)
RBC: 4.46 MIL/uL (ref 4.22–5.81)
RDW: 11.4 % — AB (ref 11.5–15.5)
WBC: 9.4 10*3/uL (ref 4.0–10.5)

## 2015-04-13 LAB — HEPATIC FUNCTION PANEL
ALBUMIN: 4.4 g/dL (ref 3.5–5.0)
ALK PHOS: 69 U/L (ref 38–126)
ALT: 34 U/L (ref 17–63)
AST: 32 U/L (ref 15–41)
BILIRUBIN DIRECT: 0.1 mg/dL (ref 0.1–0.5)
BILIRUBIN TOTAL: 0.6 mg/dL (ref 0.3–1.2)
Indirect Bilirubin: 0.5 mg/dL (ref 0.3–0.9)
Total Protein: 8.1 g/dL (ref 6.5–8.1)

## 2015-04-13 LAB — BASIC METABOLIC PANEL
Anion gap: 4 — ABNORMAL LOW (ref 5–15)
BUN: 13 mg/dL (ref 6–20)
CALCIUM: 9.2 mg/dL (ref 8.9–10.3)
CO2: 28 mmol/L (ref 22–32)
Chloride: 105 mmol/L (ref 101–111)
Creatinine, Ser: 1.01 mg/dL (ref 0.61–1.24)
GFR calc Af Amer: >60 mL/min (ref >60)
GLUCOSE: 102 mg/dL — AB (ref 65–99)
Potassium: 4.5 mmol/L (ref 3.5–5.1)
Sodium: 137 mmol/L (ref 135–145)

## 2015-04-13 LAB — I-STAT TROPONIN, ED
TROPONIN I, POC: 0.01 ng/mL (ref 0.00–0.08)
TROPONIN I, POC: 0.01 ng/mL (ref 0.00–0.08)

## 2015-04-13 LAB — LIPASE, BLOOD: Lipase: 16 U/L — ABNORMAL LOW (ref 22–51)

## 2015-04-13 SURGERY — APPENDECTOMY, LAPAROSCOPIC
Anesthesia: General

## 2015-04-13 MED ORDER — MIDAZOLAM HCL 5 MG/5ML IJ SOLN
INTRAMUSCULAR | Status: DC | PRN
  Administered 2015-04-13 (×2): 1 mg via INTRAVENOUS

## 2015-04-13 MED ORDER — ONDANSETRON 4 MG PO TBDP: 4.0000 mg | ORAL_TABLET | Freq: Four times a day (QID) | ORAL | Status: DC | PRN

## 2015-04-13 MED ORDER — ONDANSETRON HCL 4 MG/2ML IJ SOLN
4.0000 mg | Freq: Once | INTRAMUSCULAR | Status: AC
  Administered 2015-04-13: 4 mg via INTRAVENOUS
  Filled 2015-04-13: qty 2

## 2015-04-13 MED ORDER — LISINOPRIL-HYDROCHLOROTHIAZIDE 20-12.5 MG PO TABS: 1.0000 | ORAL_TABLET | Freq: Every day | ORAL | Status: DC

## 2015-04-13 MED ORDER — LACTATED RINGERS IV SOLN
INTRAVENOUS | Status: DC | PRN
  Administered 2015-04-13: 22:00:00 via INTRAVENOUS

## 2015-04-13 MED ORDER — SUCCINYLCHOLINE CHLORIDE 20 MG/ML IJ SOLN
INTRAMUSCULAR | Status: DC | PRN
  Administered 2015-04-13: 100 mg via INTRAVENOUS

## 2015-04-13 MED ORDER — ROCURONIUM BROMIDE 100 MG/10ML IV SOLN
INTRAVENOUS | Status: DC | PRN
  Administered 2015-04-13: 10 mg via INTRAVENOUS
  Administered 2015-04-13: 30 mg via INTRAVENOUS

## 2015-04-13 MED ORDER — ONDANSETRON HCL 4 MG/2ML IJ SOLN: 4.0000 mg | Freq: Four times a day (QID) | INTRAMUSCULAR | Status: DC | PRN

## 2015-04-13 MED ORDER — HYDROMORPHONE HCL 1 MG/ML IJ SOLN
0.2500 mg | INTRAMUSCULAR | Status: DC | PRN
  Administered 2015-04-13 (×2): 0.5 mg via INTRAVENOUS

## 2015-04-13 MED ORDER — IOHEXOL 300 MG/ML  SOLN
100.0000 mL | Freq: Once | INTRAMUSCULAR | Status: AC | PRN
  Administered 2015-04-13: 100 mL via INTRAVENOUS

## 2015-04-13 MED ORDER — LIDOCAINE HCL (CARDIAC) 20 MG/ML IV SOLN
INTRAVENOUS | Status: DC | PRN
  Administered 2015-04-13: 100 mg via INTRAVENOUS

## 2015-04-13 MED ORDER — PROMETHAZINE HCL 25 MG/ML IJ SOLN
INTRAMUSCULAR | Status: AC
  Administered 2015-04-13: 12.5 mg via INTRAVENOUS
  Filled 2015-04-13: qty 1

## 2015-04-13 MED ORDER — PROMETHAZINE HCL 25 MG/ML IJ SOLN
6.2500 mg | INTRAMUSCULAR | Status: DC | PRN
  Administered 2015-04-13: 12.5 mg via INTRAVENOUS

## 2015-04-13 MED ORDER — PROPOFOL 10 MG/ML IV BOLUS
INTRAVENOUS | Status: DC | PRN
Start: 2015-04-13 — End: 2015-04-13
  Administered 2015-04-13: 250 mg via INTRAVENOUS

## 2015-04-13 MED ORDER — SODIUM CHLORIDE 0.9 % IV SOLN
3.0000 g | Freq: Four times a day (QID) | INTRAVENOUS | Status: DC
  Administered 2015-04-13 – 2015-04-14 (×2): 3 g via INTRAVENOUS
  Filled 2015-04-13 (×3): qty 3

## 2015-04-13 MED ORDER — BUPIVACAINE-EPINEPHRINE 0.25% -1:200000 IJ SOLN
INTRAMUSCULAR | Status: DC | PRN
  Administered 2015-04-13: 50 mL

## 2015-04-13 MED ORDER — ACETAMINOPHEN 650 MG RE SUPP: 650.0000 mg | Freq: Four times a day (QID) | RECTAL | Status: DC | PRN

## 2015-04-13 MED ORDER — BUPIVACAINE-EPINEPHRINE 0.25% -1:200000 IJ SOLN
INTRAMUSCULAR | Status: AC
  Filled 2015-04-13: qty 1

## 2015-04-13 MED ORDER — KCL IN DEXTROSE-NACL 20-5-0.45 MEQ/L-%-% IV SOLN
INTRAVENOUS | Status: DC
Start: 2015-04-14 — End: 2015-04-14
  Administered 2015-04-14: 75 mL/h via INTRAVENOUS
  Filled 2015-04-13 (×2): qty 1000

## 2015-04-13 MED ORDER — LACTATED RINGERS IR SOLN
Status: DC | PRN
  Administered 2015-04-13: 1000 mL

## 2015-04-13 MED ORDER — EPHEDRINE SULFATE 50 MG/ML IJ SOLN
INTRAMUSCULAR | Status: AC
  Filled 2015-04-13: qty 1

## 2015-04-13 MED ORDER — MORPHINE SULFATE (PF) 4 MG/ML IV SOLN
6.0000 mg | Freq: Once | INTRAVENOUS | Status: AC
  Administered 2015-04-13: 6 mg via INTRAVENOUS
  Filled 2015-04-13: qty 2

## 2015-04-13 MED ORDER — PROPOFOL 10 MG/ML IV BOLUS
INTRAVENOUS | Status: AC
  Filled 2015-04-13: qty 20

## 2015-04-13 MED ORDER — IOHEXOL 300 MG/ML  SOLN
25.0000 mL | Freq: Once | INTRAMUSCULAR | Status: AC | PRN
  Administered 2015-04-13: 25 mL via ORAL

## 2015-04-13 MED ORDER — HYDROCODONE-ACETAMINOPHEN 5-325 MG PO TABS
1.0000 | ORAL_TABLET | ORAL | Status: DC | PRN
  Administered 2015-04-14: 2 via ORAL
  Filled 2015-04-13: qty 2

## 2015-04-13 MED ORDER — MEPERIDINE HCL 50 MG/ML IJ SOLN: 6.2500 mg | INTRAMUSCULAR | Status: DC | PRN

## 2015-04-13 MED ORDER — FENTANYL CITRATE (PF) 250 MCG/5ML IJ SOLN
INTRAMUSCULAR | Status: AC
  Filled 2015-04-13: qty 25

## 2015-04-13 MED ORDER — LIDOCAINE HCL (CARDIAC) 20 MG/ML IV SOLN
INTRAVENOUS | Status: AC
  Filled 2015-04-13: qty 5

## 2015-04-13 MED ORDER — NEOSTIGMINE METHYLSULFATE 10 MG/10ML IV SOLN
INTRAVENOUS | Status: DC | PRN
  Administered 2015-04-13: 5 mg via INTRAVENOUS

## 2015-04-13 MED ORDER — LACTATED RINGERS IV SOLN
INTRAVENOUS | Status: DC
  Administered 2015-04-14: via INTRAVENOUS

## 2015-04-13 MED ORDER — HYDROMORPHONE HCL 1 MG/ML IJ SOLN
1.0000 mg | INTRAMUSCULAR | Status: DC | PRN
  Administered 2015-04-14: 1 mg via INTRAVENOUS
  Filled 2015-04-13: qty 1

## 2015-04-13 MED ORDER — ATROPINE SULFATE 0.4 MG/ML IJ SOLN
INTRAMUSCULAR | Status: AC
Start: 2015-04-13 — End: 2015-04-13
  Filled 2015-04-13: qty 2

## 2015-04-13 MED ORDER — ACETAMINOPHEN 325 MG PO TABS: 650.0000 mg | ORAL_TABLET | Freq: Four times a day (QID) | ORAL | Status: DC | PRN

## 2015-04-13 MED ORDER — GLYCOPYRROLATE 0.2 MG/ML IJ SOLN
INTRAMUSCULAR | Status: AC
  Filled 2015-04-13: qty 4

## 2015-04-13 MED ORDER — SODIUM CHLORIDE 0.9 % IJ SOLN
INTRAMUSCULAR | Status: AC
  Filled 2015-04-13: qty 10

## 2015-04-13 MED ORDER — GLYCOPYRROLATE 0.2 MG/ML IJ SOLN
INTRAMUSCULAR | Status: DC | PRN
  Administered 2015-04-13: .8 mg via INTRAVENOUS

## 2015-04-13 MED ORDER — ROCURONIUM BROMIDE 100 MG/10ML IV SOLN
INTRAVENOUS | Status: AC
  Filled 2015-04-13: qty 1

## 2015-04-13 MED ORDER — MIDAZOLAM HCL 2 MG/2ML IJ SOLN
INTRAMUSCULAR | Status: AC
  Filled 2015-04-13: qty 4

## 2015-04-13 MED ORDER — FENTANYL CITRATE (PF) 100 MCG/2ML IJ SOLN
INTRAMUSCULAR | Status: DC | PRN
  Administered 2015-04-13 (×5): 50 ug via INTRAVENOUS

## 2015-04-13 MED ORDER — HYDROMORPHONE HCL 1 MG/ML IJ SOLN
1.0000 mg | Freq: Once | INTRAMUSCULAR | Status: AC
  Administered 2015-04-13: 1 mg via INTRAVENOUS
  Filled 2015-04-13: qty 1

## 2015-04-13 MED ORDER — HYDROMORPHONE HCL 1 MG/ML IJ SOLN
INTRAMUSCULAR | Status: AC
  Administered 2015-04-13: 0.5 mg via INTRAVENOUS
  Filled 2015-04-13: qty 1

## 2015-04-13 MED ORDER — ONDANSETRON HCL 4 MG/2ML IJ SOLN
INTRAMUSCULAR | Status: AC
  Filled 2015-04-13: qty 2

## 2015-04-13 MED ORDER — 0.9 % SODIUM CHLORIDE (POUR BTL) OPTIME
TOPICAL | Status: DC | PRN
  Administered 2015-04-13: 1000 mL

## 2015-04-13 MED ORDER — ONDANSETRON HCL 4 MG/2ML IJ SOLN
INTRAMUSCULAR | Status: DC | PRN
  Administered 2015-04-13: 4 mg via INTRAVENOUS

## 2015-04-13 SURGICAL SUPPLY — 39 items
APPLIER CLIP ROT 10 11.4 M/L (STAPLE)
BENZOIN TINCTURE PRP APPL 2/3 (GAUZE/BANDAGES/DRESSINGS) ×3 IMPLANT
CLIP APPLIE ROT 10 11.4 M/L (STAPLE) IMPLANT
CLOSURE WOUND 1/2 X4 (GAUZE/BANDAGES/DRESSINGS) ×1
COVER SURGICAL LIGHT HANDLE (MISCELLANEOUS) ×3 IMPLANT
CUTTER FLEX LINEAR 45M (STAPLE) ×3 IMPLANT
DECANTER SPIKE VIAL GLASS SM (MISCELLANEOUS) ×3 IMPLANT
DRAPE LAPAROSCOPIC ABDOMINAL (DRAPES) ×3 IMPLANT
ELECT PENCIL ROCKER SW 15FT (MISCELLANEOUS) IMPLANT
ELECT REM PT RETURN 9FT ADLT (ELECTROSURGICAL) ×3
ELECTRODE REM PT RTRN 9FT ADLT (ELECTROSURGICAL) ×1 IMPLANT
ENDOLOOP SUT PDS II  0 18 (SUTURE)
ENDOLOOP SUT PDS II 0 18 (SUTURE) IMPLANT
GAUZE SPONGE 2X2 8PLY STRL LF (GAUZE/BANDAGES/DRESSINGS) ×1 IMPLANT
GLOVE BIOGEL PI IND STRL 7.0 (GLOVE) ×1 IMPLANT
GLOVE BIOGEL PI INDICATOR 7.0 (GLOVE) ×2
GLOVE SURG ORTHO 8.0 STRL STRW (GLOVE) ×3 IMPLANT
GOWN STRL REUS W/TWL LRG LVL3 (GOWN DISPOSABLE) ×3 IMPLANT
GOWN STRL REUS W/TWL XL LVL3 (GOWN DISPOSABLE) ×6 IMPLANT
KIT BASIN OR (CUSTOM PROCEDURE TRAY) ×3 IMPLANT
POUCH SPECIMEN RETRIEVAL 10MM (ENDOMECHANICALS) ×3 IMPLANT
RELOAD 45 VASCULAR/THIN (ENDOMECHANICALS) IMPLANT
RELOAD STAPLE TA45 3.5 REG BLU (ENDOMECHANICALS) ×3 IMPLANT
SET IRRIG TUBING LAPAROSCOPIC (IRRIGATION / IRRIGATOR) ×3 IMPLANT
SHEARS HARMONIC ACE PLUS 36CM (ENDOMECHANICALS) IMPLANT
SOLUTION ANTI FOG 6CC (MISCELLANEOUS) IMPLANT
SPONGE GAUZE 2X2 STER 10/PKG (GAUZE/BANDAGES/DRESSINGS) ×2
STRIP CLOSURE SKIN 1/2X4 (GAUZE/BANDAGES/DRESSINGS) ×2 IMPLANT
SUT MNCRL AB 4-0 PS2 18 (SUTURE) ×3 IMPLANT
TAPE CLOTH SURG 4X10 WHT LF (GAUZE/BANDAGES/DRESSINGS) ×3 IMPLANT
TOWEL OR 17X26 10 PK STRL BLUE (TOWEL DISPOSABLE) ×3 IMPLANT
TRAY FOLEY W/METER SILVER 14FR (SET/KITS/TRAYS/PACK) ×3 IMPLANT
TRAY FOLEY W/METER SILVER 16FR (SET/KITS/TRAYS/PACK) ×3 IMPLANT
TRAY LAPAROSCOPIC (CUSTOM PROCEDURE TRAY) ×3 IMPLANT
TROCAR BLADELESS OPT 5 75 (ENDOMECHANICALS) ×3 IMPLANT
TROCAR XCEL BLUNT TIP 100MML (ENDOMECHANICALS) ×3 IMPLANT
TROCAR XCEL NON-BLD 11X100MML (ENDOMECHANICALS) ×3 IMPLANT
TUBING INSUFFLATION 10FT LAP (TUBING) ×3 IMPLANT
WATER STERILE IRR 1500ML POUR (IV SOLUTION) ×3 IMPLANT

## 2015-04-13 NOTE — Transfer of Care (Signed)
Immediate Anesthesia Transfer of Care Note  Patient: Jake Hoffman  Procedure(s) Performed: Procedure(s): APPENDECTOMY LAPAROSCOPIC (N/A)  Patient Location: PACU  Anesthesia Type:General  Level of Consciousness: awake, alert , oriented and patient cooperative  Airway & Oxygen Therapy: Patient Spontanous Breathing and Patient connected to face mask oxygen  Post-op Assessment: Report given to RN, Post -op Vital signs reviewed and stable and Patient moving all extremities  Post vital signs: Reviewed and stable  Last Vitals:  Filed Vitals:   04/13/15 2100  BP: 165/87  Pulse: 90  Temp:   Resp:     Complications: No apparent anesthesia complications

## 2015-04-13 NOTE — Op Note (Signed)
OPERATIVE REPORT - LAPAROSCOPIC APPENDECTOMY  Preop diagnosis: Acute appendicitis  Postop diagnosis: Same  Procedure: Laparoscopic appendectomy  Surgeon:  Velora Heckler, MD, FACS  Anesthesia: General endotracheal  Estimated blood loss: Minimal  Preparation: Chlora-prep  Complications: None  Indications:  Patient is a 29 yo BM with signs and symptoms of acute appendicitis.  CTA positive for acute appendicitis.  Now for lap appendectomy.  Procedure:  Patient is brought to the operating room and placed in a supine position on the operating room table. Following administration of general anesthesia, a time out was held and the patient's name and procedure is confirmed. Patient is then prepped and draped in the usual strict aseptic fashion.  After ascertaining that an adequate level of anesthesia has been achieved, a peri-umbilical incision is made with a #15 blade. Dissection is carried down to the fascia. Fascia is incised in the midline and the peritoneal cavity is entered cautiously. A #0-vicryl pursestring suture is placed in the fascia. An Hassan cannula is introduced under direct vision and secured with the pursestring suture. The abdomen is insufflated with carbon dioxide. The laparoscope is introduced and the abdomen is explored. Operative ports are placed in the right upper quadrant and left lower quadrant. The appendix is identified. The mesoappendix is divided with the harmonic scalpel. Dissection is carried down to the base of the appendix. The base of the appendix is dissected out clearing the junction with the cecal wall. Using an Endo-GIA stapler, the base of the appendix is transected at the junction with the cecal wall. There is good approximation of tissue along the staple line. There is good hemostasis along the staple line. The appendix is placed into an endo-catch bag and withdrawn through the umbilical port. The #0-vicryl pursestring suture is tied securely.  Right lower  quadrant is irrigated with warm saline which is evacuated. Good hemostasis is noted. Ports are removed under direct vision. Good hemostasis is noted at the port sites. Pneumoperitoneum is released.  Skin incisions are anesthetized with local anesthetic. Wounds are closed with interrupted 4-0 Monocryl subcuticular sutures. Wounds are washed and dried and benzoin and Steri-Strips are applied. Dressings are applied. The patient is awakened from anesthesia and brought to the recovery room. The patient tolerated the procedure well.  Velora Heckler, MD, [facility] Office: [phone]

## 2015-04-13 NOTE — Anesthesia Preprocedure Evaluation (Addendum)
Anesthesia Evaluation  Patient identified by MRN, date of birth, ID band Patient awake    Reviewed: Allergy & Precautions, NPO status , Patient's Chart, lab work & pertinent test results  Airway Mallampati: II  TM Distance: >3 FB Neck ROM: Full    Dental  (+) Teeth Intact   Pulmonary neg pulmonary ROS,  breath sounds clear to auscultation        Cardiovascular Exercise Tolerance: Good hypertension, Pt. on medications Rhythm:Regular Rate:Normal     Neuro/Psych negative neurological ROS  negative psych ROS   GI/Hepatic negative GI ROS, Neg liver ROS,   Endo/Other  negative endocrine ROS  Renal/GU negative Renal ROS  negative genitourinary   Musculoskeletal negative musculoskeletal ROS (+)   Abdominal   Peds negative pediatric ROS (+)  Hematology negative hematology ROS (+)   Anesthesia Other Findings   Reproductive/Obstetrics negative OB ROS                            Lab Results  Component Value Date   WBC 9.4 04/13/2015   HGB 15.1 04/13/2015   HCT 44.2 04/13/2015   MCV 99.1 04/13/2015   PLT 266 04/13/2015   Lab Results  Component Value Date   CREATININE 1.01 04/13/2015   BUN 13 04/13/2015   NA 137 04/13/2015   K 4.5 04/13/2015   CL 105 04/13/2015   CO2 28 04/13/2015   No results found for: INR, PROTIME  EKG: normal sinus rhythm.   Anesthesia Physical Anesthesia Plan  ASA: II  Anesthesia Plan: General   Post-op Pain Management:    Induction: Intravenous  Airway Management Planned: Oral ETT  Additional Equipment:   Intra-op Plan:   Post-operative Plan: Extubation in OR  Informed Consent: I have reviewed the patients History and Physical, chart, labs and discussed the procedure including the risks, benefits and alternatives for the proposed anesthesia with the patient or authorized representative who has indicated his/her understanding and acceptance.   Dental  advisory given  Plan Discussed with: CRNA  Anesthesia Plan Comments:         Anesthesia Quick Evaluation

## 2015-04-13 NOTE — ED Provider Notes (Signed)
CSN: [id number]     Arrival date & time 04/13/15  1318 History   First MD Initiated Contact with Patient 04/13/15 1445     Chief Complaint  Patient presents with  . Abdominal Pain  . Shortness of Breath  . Nausea     (Consider location/radiation/quality/duration/timing/severity/associated sxs/prior Treatment) HPI  29 year old male who is moving stuff earlier today and had acute onset of epigastric aching and sharp pain. Likely these are happened before. No nausea, vomiting, diarrhea, constipation since that time. He also states he has no shortness of breath but did state taking deep breaths made the pain worse. No rash. Symptoms made worse with movement and better with rest. Has had started around 11:00 today. Now the patient is having a sharp pain underneath his right breast. Decreased appetite.  Past Medical History  Diagnosis Date  . Hypertension   . LVH (left ventricular hypertrophy)   . Hernia of abdominal cavity    History reviewed. No pertinent past surgical history. Family History  Problem Relation Age of Onset  . Hyperlipidemia Mother   . Hypertension Mother   . Arthritis Mother   . Hypertension Father    Social History  Substance Use Topics  . Smoking status: Never Smoker   . Smokeless tobacco: None  . Alcohol Use: Yes    Review of Systems  Constitutional: Negative for fever, chills and fatigue.  HENT: Negative for ear pain, mouth sores and rhinorrhea.   Eyes: Negative for photophobia and pain.  Respiratory: Positive for shortness of breath. Negative for cough and wheezing.   Cardiovascular: Negative for chest pain, palpitations and leg swelling.  Gastrointestinal: Positive for abdominal pain. Negative for nausea, diarrhea and constipation.  Endocrine: Negative for polydipsia and polyuria.  Genitourinary: Negative for dysuria and frequency.  Musculoskeletal: Negative for myalgias.  Skin: Negative for rash and wound.  Neurological: Negative for dizziness and  headaches.      Allergies  Review of patient's allergies indicates no known allergies.  Home Medications   Prior to Admission medications   Medication Sig Start Date End Date Taking? Authorizing Provider  lisinopril-hydrochlorothiazide (ZESTORETIC) 20-12.5 MG per tablet Take 1 tablet by mouth daily. 05/11/14  Yes Eulis Foster, FNP  amoxicillin (AMOXIL) 500 MG capsule Take 1 capsule (500 mg total) by mouth 3 (three) times daily. 06/04/14   Madelin Headings, MD  ibuprofen (ADVIL,MOTRIN) 800 MG tablet Take 1 tablet (800 mg total) by mouth every 8 (eight) hours as needed. Patient not taking: Reported on 04/13/2015 01/17/14   Eulis Foster, FNP   BP 158/89 mmHg  Pulse 93  Temp(Src) 97.4 F (36.3 C) (Oral)  Resp 24  SpO2 100% Physical Exam  Constitutional: He is oriented to person, place, and time. He appears well-developed and well-nourished.  HENT:  Head: Normocephalic and atraumatic.  Eyes: Conjunctivae and EOM are normal.  Neck: Normal range of motion. Neck supple.  Cardiovascular: Normal rate and regular rhythm.   Pulmonary/Chest: Effort normal. No respiratory distress.  Abdominal: Soft. He exhibits no distension. There is no tenderness. There is no rebound and no guarding.  Musculoskeletal: Normal range of motion. He exhibits no edema or tenderness.  Neurological: He is alert and oriented to person, place, and time.  Skin: Skin is warm and dry.  Nursing note and vitals reviewed.   ED Course  Procedures (including critical care time) Labs Review Labs Reviewed  CBC - Abnormal; Notable for the following:    RDW 11.4 (*)    All  other components within normal limits  BASIC METABOLIC PANEL  I-STAT TROPOININ, ED    Imaging Review Dg Chest 2 View  04/13/2015   CLINICAL DATA:  Epigastric pain, chest pain, shortness of breath  EXAM: CHEST  2 VIEW  COMPARISON:  None.  FINDINGS: The heart size and mediastinal contours are within normal limits. Both lungs are clear. The visualized  skeletal structures are unremarkable.  IMPRESSION: No active cardiopulmonary disease.   Electronically Signed   By: Elige Ko   On: 04/13/2015 14:09   I have personally reviewed and evaluated these images and lab results as part of my medical decision-making.   EKG Interpretation   Date/Time:  Saturday April 13 2015 13:29:53 EDT Ventricular Rate:  88 PR Interval:  159 QRS Duration: 84 QT Interval:  382 QTC Calculation: 462 R Axis:   75 Text Interpretation:  Sinus rhythm LAE, consider biatrial enlargement  Probable anteroseptal infarct, old Abnrm T, consider ischemia,  anterolateral lds Similar to previous ECG (scanned in) dated 07/11/2013  Confirmed by [facility] MD, Barbara Cower [phone]) on 04/13/2015 2:54:05 PM            MDM   Final diagnosis:  Appendicitis  29 year old male acute onset epigastric pain now radiating to right upper quadrant with little bit of nausea shows a breath secondary to pain. Exam with tenderness in the same area. Differential diagnosis is broad to include gallbladder, pancreas, small intestine, stomach pathology. We'll start with labs, pain control and right upper quadrant ultrasound. We'll reassess for need for further imaging or workup.  Labs relatively unremarkable however patient still with persistent pain so CT scan was done which showed acute appendicitis. Discussed case with surgery who will take to the OR.     Marily Memos, MD 04/14/15 [phone]

## 2015-04-13 NOTE — ED Notes (Signed)
Pt complaining of a sharp, aching epigastric pain that started around 12:30 this afternoon. States it's a 10/10 pain, was working/lifting heavy objects while at work. Hx of lower abdominal hernia, left ventricular hypertrophy, abnormal heart rhythm (family unsure which one specifically). Denies vomiting/diarrhea, fever/chills, back pain. States he is feeling slightly nauseous and SOB. Vitals stable, epigastric area tender to touch, no masses/gross pulsating felt on palpation to area.

## 2015-04-13 NOTE — ED Notes (Signed)
Patient transported to CT 

## 2015-04-13 NOTE — ED Notes (Signed)
Bed: WA12 Expected date:  Expected time:  Means of arrival:  Comments: Hold for triage 2 

## 2015-04-13 NOTE — Anesthesia Postprocedure Evaluation (Signed)
  Anesthesia Post-op Note  Patient: Jake Hoffman  Procedure(s) Performed: Procedure(s): APPENDECTOMY LAPAROSCOPIC (N/A)  Patient Location: PACU  Anesthesia Type:General  Level of Consciousness: awake, alert  and oriented  Airway and Oxygen Therapy: Patient Spontanous Breathing  Post-op Pain: mild  Post-op Assessment: Post-op Vital signs reviewed and Patient's Cardiovascular Status Stable              Post-op Vital Signs: Reviewed and stable  Last Vitals:  Filed Vitals:   04/13/15 2330  BP: 139/84  Pulse: 82  Temp:   Resp: 22    Complications: No apparent anesthesia complications

## 2015-04-13 NOTE — H&P (Signed)
Jake Hoffman is an 29 y.o. male.    General Surgery [facility]  Chief Complaint:  Abdominal pain, acute appendicitis  HPI: patient is a 29 yo BM with 3 day hx of crampy abdominal pain localizing to the epigastrium and right mid abdomen.  Presents to ER for evaluation.  USN negative for acute cholecystitis.  CT scan abdomen positive for acute appendicitis.  No prior abdominal surgery.  Past Medical History  Diagnosis Date  . Hypertension   . LVH (left ventricular hypertrophy)   . Hernia of abdominal cavity     History reviewed. No pertinent past surgical history.  Family History  Problem Relation Age of Onset  . Hyperlipidemia Mother   . Hypertension Mother   . Arthritis Mother   . Hypertension Father    Social History:  reports that he has never smoked. He does not have any smokeless tobacco history on file. He reports that he drinks alcohol. He reports that he does not use illicit drugs.  Allergies: No Known Allergies   (Not in a hospital admission)  Results for orders placed or performed during the hospital encounter of 04/13/15 (from the past 48 hour(s))  Basic metabolic panel     Status: Abnormal   Collection Time: 04/13/15  1:58 PM  Result Value Ref Range   Sodium 137 135 - 145 mmol/L   Potassium 4.5 3.5 - 5.1 mmol/L   Chloride 105 101 - 111 mmol/L   CO2 28 22 - 32 mmol/L   Glucose, Bld 102 (H) 65 - 99 mg/dL   BUN 13 6 - 20 mg/dL   Creatinine, Ser 1.01 0.61 - 1.24 mg/dL   Calcium 9.2 8.9 - 10.3 mg/dL   GFR calc non Af Amer >60 >60 mL/min   GFR calc Af Amer >60 >60 mL/min    Comment: (NOTE) The eGFR has been calculated using the CKD EPI equation. This calculation has not been validated in all clinical situations. eGFR's persistently <60 mL/min signify possible Chronic Kidney Disease.    Anion gap 4 (L) 5 - 15  CBC     Status: Abnormal   Collection Time: 04/13/15  1:58 PM  Result Value Ref Range   WBC 9.4 4.0 - 10.5 K/uL   RBC  4.46 4.22 - 5.81 MIL/uL   Hemoglobin 15.1 13.0 - 17.0 g/dL   HCT 44.2 39.0 - 52.0 %   MCV 99.1 78.0 - 100.0 fL   MCH 33.9 26.0 - 34.0 pg   MCHC 34.2 30.0 - 36.0 g/dL   RDW 11.4 (L) 11.5 - 15.5 %   Platelets 266 150 - 400 K/uL  Lipase, blood     Status: Abnormal   Collection Time: 04/13/15  1:58 PM  Result Value Ref Range   Lipase 16 (L) 22 - 51 U/L  Hepatic function panel     Status: None   Collection Time: 04/13/15  1:58 PM  Result Value Ref Range   Total Protein 8.1 6.5 - 8.1 g/dL   Albumin 4.4 3.5 - 5.0 g/dL   AST 32 15 - 41 U/L   ALT 34 17 - 63 U/L   Alkaline Phosphatase 69 38 - 126 U/L   Total Bilirubin 0.6 0.3 - 1.2 mg/dL   Bilirubin, Direct 0.1 0.1 - 0.5 mg/dL   Indirect Bilirubin 0.5 0.3 - 0.9 mg/dL  I-stat troponin, ED     Status: None   Collection Time: 04/13/15  2:07 PM  Result Value Ref Range  Troponin i, poc 0.01 0.00 - 0.08 ng/mL   Comment 3            Comment: Due to the release kinetics of cTnI, a negative result within the first hours of the onset of symptoms does not rule out myocardial infarction with certainty. If myocardial infarction is still suspected, repeat the test at appropriate intervals.   I-stat troponin, ED     Status: None   Collection Time: 04/13/15  6:52 PM  Result Value Ref Range   Troponin i, poc 0.01 0.00 - 0.08 ng/mL   Comment 3            Comment: Due to the release kinetics of cTnI, a negative result within the first hours of the onset of symptoms does not rule out myocardial infarction with certainty. If myocardial infarction is still suspected, repeat the test at appropriate intervals.    Dg Chest 2 View  04/13/2015   CLINICAL DATA:  Epigastric pain, chest pain, shortness of breath  EXAM: CHEST  2 VIEW  COMPARISON:  None.  FINDINGS: The heart size and mediastinal contours are within normal limits. Both lungs are clear. The visualized skeletal structures are unremarkable.  IMPRESSION: No active cardiopulmonary disease.    Electronically Signed   By: Kathreen Devoid   On: 04/13/2015 14:09   Ct Abdomen Pelvis W Contrast  04/13/2015   CLINICAL DATA:  Sharp epigastric abdominal pain since 12:30 p.m. today.  EXAM: CT ABDOMEN AND PELVIS WITH CONTRAST  TECHNIQUE: Multidetector CT imaging of the abdomen and pelvis was performed using the standard protocol following bolus administration of intravenous contrast.  CONTRAST:  58mL OMNIPAQUE IOHEXOL 300 MG/ML SOLN, 128mL OMNIPAQUE IOHEXOL 300 MG/ML SOLN  COMPARISON:  None.  FINDINGS: The distal appendix is enlarged, measuring 13.6 mm in maximum diameter on coronal image number 43 and 13.1 mm in diameter on axial image number 39. There is mild periappendiceal soft tissue stranding. No fluid collections or free peritoneal air are seen. The appendix is located in the right mid abdomen, anterior to the right colon.  Small gallstones in the gallbladder measuring up to 2 mm in diameter each. No gallbladder wall thickening or pericholecystic fluid. Several small areas of low density in the liver, too small to accurately characterize using CT criteria. Unremarkable spleen, pancreas, adrenal glands, kidneys, ureters, urinary bladder and prostate gland. Minimal atelectasis at both lung bases. Mild bilateral hip degenerative changes. Mild lower thoracic spine degenerative changes.  IMPRESSION: 1. Acute appendicitis without abscess. 2. Cholelithiasis without evidence of cholecystitis. 3. Probable small cysts or hemangiomas in the liver. These results were called by telephone at the time of interpretation on 04/13/2015 at 6:54 pm to Dr. Merrily Pew , who verbally acknowledged these results.   Electronically Signed   By: Claudie Revering M.D.   On: 04/13/2015 18:54   US Abdomen Limited Ruq  04/13/2015   CLINICAL DATA:  29 year old male with acute right upper quadrant pain today.  EXAM: US ABDOMEN LIMITED - RIGHT UPPER QUADRANT  COMPARISON:  None.  FINDINGS: Gallbladder:  The gallbladder is unremarkable. There is  no evidence of cholelithiasis or acute cholecystitis.  Common bile duct:  Diameter: 3 mm. There is no evidence of intrahepatic or extrahepatic biliary dilatation.  Liver:  No focal lesion identified. Within normal limits in parenchymal echogenicity.  There is no evidence of free fluid within the right upper abdomen.  IMPRESSION: Unremarkable right upper quadrant abdominal ultrasound.   Electronically Signed   By: Dellis Filbert  Hu M.D.   On: 04/13/2015 15:53    Review of Systems  Constitutional: Negative.   HENT: Negative.   Eyes: Negative.   Respiratory: Negative.   Cardiovascular: Negative.   Gastrointestinal: Positive for abdominal pain.  Genitourinary: Negative.   Musculoskeletal: Negative.   Skin: Negative.   Neurological: Negative.   Endo/Heme/Allergies: Negative.   Psychiatric/Behavioral: Negative.     Blood pressure 153/82, pulse 88, temperature 98.6 F (37 C), temperature source Oral, resp. rate 18, SpO2 99 %. Physical Exam  Constitutional: He is oriented to person, place, and time. He appears well-developed and well-nourished. No distress.  HENT:  Head: Normocephalic and atraumatic.  Right Ear: External ear normal.  Left Ear: External ear normal.  Eyes: Conjunctivae are normal. Pupils are equal, round, and reactive to light. No scleral icterus.  Neck: Normal range of motion. Neck supple. No thyromegaly present.  Cardiovascular: Normal rate, regular rhythm and normal heart sounds.   No murmur heard. Respiratory: Effort normal and breath sounds normal. He has no wheezes.  GI: Soft. Bowel sounds are normal. He exhibits no distension and no mass. There is tenderness (right mid abdomen). There is no rebound and no guarding.  Musculoskeletal: Normal range of motion. He exhibits no edema.  Neurological: He is alert and oriented to person, place, and time.  Skin: Skin is warm and dry. He is not diaphoretic.  Psychiatric: He has a normal mood and affect. His behavior is normal.      Assessment/Plan  Acute appendicitis  Admit to general surgery service  Unasyn 3 gm IV in OR holding area  Will proceed with lap appendectomy tonight  The risks and benefits of the procedure have been discussed at length with the patient.  The patient understands the proposed procedure, potential alternative treatments, and the course of recovery to be expected.  All of the patient's questions have been answered at this time.  The patient wishes to proceed with surgery.  Earnstine Regal, MD, [facility] Office: [place] 04/13/2015, 9:03 PM

## 2015-04-13 NOTE — ED Notes (Signed)
Last meal 11am today

## 2015-04-13 NOTE — Anesthesia Procedure Notes (Signed)
Procedure Name: Intubation Date/Time: 04/13/2015 9:45 PM Performed by: Jarvis Newcomer A Pre-anesthesia Checklist: Timeout performed, Patient identified, Emergency Drugs available, Suction available and Patient being monitored Patient Re-evaluated:Patient Re-evaluated prior to inductionOxygen Delivery Method: Circle system utilized Preoxygenation: Pre-oxygenation with 100% oxygen Intubation Type: Rapid sequence, Cricoid Pressure applied and IV induction Laryngoscope Size: Mac and 4 Grade View: Grade I Tube type: Oral Tube size: 7.5 mm Number of attempts: 1 Airway Equipment and Method: Stylet Placement Confirmation: breath sounds checked- equal and bilateral,  ETT inserted through vocal cords under direct vision and positive ETCO2 Secured at: 23 cm Tube secured with: Tape Dental Injury: Teeth and Oropharynx as per pre-operative assessment  Comments: RSI with cricoid pressure by Dr. Hart [place].

## 2015-04-14 MED ORDER — LISINOPRIL 20 MG PO TABS
20.0000 mg | ORAL_TABLET | Freq: Every day | ORAL | Status: DC
  Administered 2015-04-14: 20 mg via ORAL
  Filled 2015-04-14: qty 1

## 2015-04-14 MED ORDER — HYDROCHLOROTHIAZIDE 12.5 MG PO CAPS
12.5000 mg | ORAL_CAPSULE | Freq: Every day | ORAL | Status: DC
  Administered 2015-04-14: 12.5 mg via ORAL
  Filled 2015-04-14: qty 1

## 2015-04-14 MED ORDER — HYDROCODONE-ACETAMINOPHEN 5-325 MG PO TABS: 1.0000 | ORAL_TABLET | ORAL | Status: DC | PRN

## 2015-04-14 NOTE — Progress Notes (Signed)
RN reviewed discharge instructions with patient and family. All questions answered.   Paperwork and prescriptions given.   NT rolled patient and all belongings down to family car.   

## 2015-04-14 NOTE — Discharge Summary (Signed)
   Patient ID: Jake Hoffman [id number] 29 y.o. 09/12/1985  04/13/2015  Discharge date and time: 04/14/2015   Admitting Physician: Darnell Level  Discharge Physician: Glenna Fellows T  Admission Diagnoses: Appendicitis  Discharge Diagnoses: Same  Operations: Procedure(s): APPENDECTOMY LAPAROSCOPIC  Admission Condition: fair  Discharged Condition: good  Indication for Admission: Patient is a 29 year old male who presents with symptoms and physical findings consistent with acute appendicitis which was confirmed on CT scan.  Hospital Course:  On the day of admission he underwent an uneventful laparoscopic appendectomy with findings of acute appendicitis without perforation or gangrene. He tolerated the procedure well. The following morning he is ambulatory. Pain is improved. No nausea and tolerating diet. Abdomen is minimally tender and dressings clean and dry. He is felt ready for discharge.  Disposition: Home  Patient Instructions:    Medication List    STOP taking these medications        amoxicillin 500 MG capsule  Commonly known as:  AMOXIL      TAKE these medications        HYDROcodone-acetaminophen 5-325 MG per tablet  Commonly known as:  NORCO/VICODIN  Take 1-2 tablets by mouth every 4 (four) hours as needed for moderate pain.     ibuprofen 800 MG tablet  Commonly known as:  ADVIL,MOTRIN  Take 1 tablet (800 mg total) by mouth every 8 (eight) hours as needed.     lisinopril-hydrochlorothiazide 20-12.5 MG per tablet  Commonly known as:  ZESTORETIC  Take 1 tablet by mouth daily.        Activity: no heavy lifting for 2 weeks Diet: regular diet Wound Care: none needed  Follow-up:  With Dr. Gerrit Friends in 2 weeks.  Signed: Mariella Saa MD, FACS  04/14/2015, 7:53 AM

## 2015-04-14 NOTE — Discharge Instructions (Signed)
[facility] ______[facility] °LAPAROSCOPIC SURGERY: POST OP INSTRUCTIONS °Always review your discharge instruction sheet given to you by the facility where your surgery was performed. °IF YOU HAVE DISABILITY OR FAMILY LEAVE FORMS, YOU MUST BRING THEM TO THE OFFICE FOR PROCESSING.   °DO NOT GIVE THEM TO YOUR DOCTOR. ° °1. A prescription for pain medication may be given to you upon discharge.  Take your pain medication as prescribed, if needed.  If narcotic pain medicine is not needed, then you may take acetaminophen (Tylenol) or ibuprofen (Advil) as needed. °2. Take your usually prescribed medications unless otherwise directed. °3. If you need a refill on your pain medication, please contact your pharmacy.  They will contact our office to request authorization. Prescriptions will not be filled after 5pm or on week-ends. °4. You should follow a light diet the first few days after arrival home, such as soup and crackers, etc.  Be sure to include lots of fluids daily. °5. Most patients will experience some swelling and bruising in the area of the incisions.  Ice packs will help.  Swelling and bruising can take several days to resolve.  °6. It is common to experience some constipation if taking pain medication after surgery.  Increasing fluid intake and taking a stool softener (such as Colace) will usually help or prevent this problem from occurring.  A mild laxative (Milk of Magnesia or Miralax) should be taken according to package instructions if there are no bowel movements after 48 hours. °7. Unless discharge instructions indicate otherwise, you may remove your bandages 24-48 hours after surgery, and you may shower at that time.  You may have steri-strips (small skin tapes) in place directly over the incision.  These strips should be left on the skin for 7-10 days.  If your surgeon used skin glue on the incision, you may shower in 24 hours.  The glue will flake off over the next 2-3 weeks.  Any sutures or  staples will be removed at the office during your follow-up visit. °8. ACTIVITIES:  You may resume regular (light) daily activities beginning the next day--such as daily self-care, walking, climbing stairs--gradually increasing activities as tolerated.  You may have sexual intercourse when it is comfortable.  Refrain from any heavy lifting or straining until approved by your doctor. °a. You may drive when you are no longer taking prescription pain medication, you can comfortably wear a seatbelt, and you can safely maneuver your car and apply brakes. °b. RETURN TO WORK:  __________________________________________________________ °9. You should see your doctor in the office for a follow-up appointment approximately 2-3 weeks after your surgery.  Make sure that you call for this appointment within a day or two after you arrive home to insure a convenient appointment time. °10. OTHER INSTRUCTIONS: __________________________________________________________________________________________________________________________ __________________________________________________________________________________________________________________________ °WHEN TO CALL YOUR DOCTOR: °1. Fever over 101.0 °2. Inability to urinate °3. Continued bleeding from incision. °4. Increased pain, redness, or drainage from the incision. °5. Increasing abdominal pain ° °The clinic staff is available to answer your questions during regular business hours.  Please don’t hesitate to call and ask to speak to one of the nurses for clinical concerns.  If you have a medical emergency, go to the nearest emergency room or call 911.  A surgeon from [facility] is always on call at the hospital. °[address], [address], [place], [place]  [address] ? [address] [address], [place], [place]   [address] °[phone] ? [phone] ? FAX [phone] °Web site:   [email or web address] °

## 2015-04-16 ENCOUNTER — Encounter: Payer: Self-pay | Admitting: Adult Health

## 2015-04-16 ENCOUNTER — Telehealth: Payer: Self-pay | Admitting: Adult Health

## 2015-04-16 ENCOUNTER — Ambulatory Visit (INDEPENDENT_AMBULATORY_CARE_PROVIDER_SITE_OTHER): Payer: BLUE CROSS/BLUE SHIELD | Admitting: Adult Health

## 2015-04-16 VITALS — BP 120/80 | Temp 98.3°F | Wt 249.8 lb

## 2015-04-16 DIAGNOSIS — K3580 Unspecified acute appendicitis: Secondary | ICD-10-CM | POA: Diagnosis not present

## 2015-04-16 DIAGNOSIS — Z09 Encounter for follow-up examination after completed treatment for conditions other than malignant neoplasm: Secondary | ICD-10-CM | POA: Diagnosis not present

## 2015-04-16 LAB — BASIC METABOLIC PANEL
BUN: 9 mg/dL (ref 6–23)
CHLORIDE: 99 meq/L (ref 96–112)
CO2: 33 mEq/L — ABNORMAL HIGH (ref 19–32)
CREATININE: 1.13 mg/dL (ref 0.40–1.50)
Calcium: 9.7 mg/dL (ref 8.4–10.5)
GFR: 98.3 mL/min (ref >60.00)
GLUCOSE: 79 mg/dL (ref 70–99)
POTASSIUM: 4.4 meq/L (ref 3.5–5.1)
Sodium: 139 mEq/L (ref 135–145)

## 2015-04-16 LAB — CBC
HEMATOCRIT: 47.1 % (ref 39.0–52.0)
HEMOGLOBIN: 15.7 g/dL (ref 13.0–17.0)
MCHC: 33.4 g/dL (ref 30.0–36.0)
MCV: 99 fl (ref 78.0–100.0)
Platelets: 293 10*3/uL (ref 150.0–400.0)
RBC: 4.76 Mil/uL (ref 4.22–5.81)
RDW: 11.8 % (ref 11.5–15.5)
WBC: 6.3 10*3/uL (ref 4.0–10.5)

## 2015-04-16 NOTE — Telephone Encounter (Signed)
Left VM informing patient of results from blood work

## 2015-04-16 NOTE — Progress Notes (Addendum)
Subjective:    Patient ID: Jake Hoffman, male    DOB: 10/06/1985, 29 y.o.   MRN: [id number]  HPI  29 year old patient who presents to the office today for post-hospital visit.He was seen in the ER on 04/13/2015. Per ER note   "29 year old male who is moving stuff earlier today and had acute onset of epigastric aching and sharp pain. Likely these are happened before. No nausea, vomiting, diarrhea, constipation since that time. He also states he has no shortness of breath but did state taking deep breaths made the pain worse. No rash. Symptoms made worse with movement and better with rest. Has had started around 11:00 today. Now the patient is having a sharp pain underneath his right breast. Decreased appetite.  Labs relatively unremarkable however patient still with persistent pain so CT scan was done which showed acute appendicitis. " He had laparoscopic appendectomy done   He is taking Percocet, four times a day for pain and feels as though it works well. Advised to take stool softner with this. Overall, he endorses feeling "back to normal". Has had no complications from surgery at this time.   Review of Systems  Constitutional: Negative.   HENT: Negative.   Respiratory: Negative.   Cardiovascular: Negative.   Gastrointestinal: Positive for constipation. Negative for nausea, vomiting, abdominal pain, diarrhea, abdominal distention, anal bleeding and rectal pain.  Endocrine: Negative.   Genitourinary: Negative.   Musculoskeletal: Negative.   Skin: Negative.   Allergic/Immunologic: Negative.   Neurological: Negative.   Psychiatric/Behavioral: Negative.   All other systems reviewed and are negative.  Past Medical History  Diagnosis Date  . Hypertension   . LVH (left ventricular hypertrophy)   . Hernia of abdominal cavity     Social History   Social History  . Marital Status: Single    Spouse Name: N/A  . Number of Children: N/A  . Years of Education: N/A   Occupational  History  . Not on file.   Social History Main Topics  . Smoking status: Never Smoker   . Smokeless tobacco: Not on file  . Alcohol Use: Yes  . Drug Use: No  . Sexual Activity: Not on file   Other Topics Concern  . Not on file   Social History Narrative    No past surgical history on file.  Family History  Problem Relation Age of Onset  . Hyperlipidemia Mother   . Hypertension Mother   . Arthritis Mother   . Hypertension Father     No Known Allergies  Current Outpatient Prescriptions on File Prior to Visit  Medication Sig Dispense Refill  . HYDROcodone-acetaminophen (NORCO/VICODIN) 5-325 MG per tablet Take 1-2 tablets by mouth every 4 (four) hours as needed for moderate pain. 30 tablet 0  . lisinopril-hydrochlorothiazide (ZESTORETIC) 20-12.5 MG per tablet Take 1 tablet by mouth daily. 90 tablet 3  . ibuprofen (ADVIL,MOTRIN) 800 MG tablet Take 1 tablet (800 mg total) by mouth every 8 (eight) hours as needed. (Patient not taking: Reported on 04/13/2015) 30 tablet 0   No current facility-administered medications on file prior to visit.    BP 120/80 mmHg  Temp(Src) 98.3 F (36.8 C) (Oral)  Wt 249 lb 12.8 oz (113.309 kg)       Objective:   Physical Exam  Constitutional: He is oriented to person, place, and time. He appears well-developed and well-nourished. No distress.  Cardiovascular: Normal rate, regular rhythm, normal heart sounds and intact distal pulses.  Exam reveals no  gallop and no friction rub.   No murmur heard. Pulmonary/Chest: Effort normal and breath sounds normal. No respiratory distress. He has no wheezes. He has no rales.  Abdominal: Soft. Bowel sounds are normal. He exhibits no distension and no mass. There is no tenderness. There is no rebound and no guarding.  Musculoskeletal: Normal range of motion. He exhibits tenderness (slight tenderness with palpation to abdomen ( sites of incision)). He exhibits no edema.  Neurological: He is alert and oriented  to person, place, and time.  Skin: Skin is warm and dry. No rash noted. He is not diaphoretic. No erythema. No pallor.  Wounds on abdomen well healed. No bleeding. No signs of infection  Psychiatric: He has a normal mood and affect. His behavior is normal. Judgment and thought content normal.  Nursing note and vitals reviewed.      Assessment & Plan:  1. Hospital discharge follow-up - Continue to monitor for signs of infection and follow up with any.  - Decrease Percocet intake, take stool softner and fiber subblement - Basic metabolic panel - CBC - Follow up with Surgery in September

## 2015-04-16 NOTE — Progress Notes (Signed)
Pre visit review using our clinic review tool, if applicable. No additional management support is needed unless otherwise documented below in the visit note. 

## 2015-04-16 NOTE — Patient Instructions (Signed)
It was great meeting you today!  You appear to be doing well after tour surgery.   Stool softner, fiber and plenty of water while taking the Percoset.   If you notice any signs of infection, please let me know.   You are scheduled for 05/01/2015 for your establish care visit.

## 2015-04-18 NOTE — Addendum Note (Signed)
Addended by: Nancy Fetter on: 04/18/2015 07:38 AM   Modules accepted: Level of Service

## 2015-04-30 ENCOUNTER — Ambulatory Visit (INDEPENDENT_AMBULATORY_CARE_PROVIDER_SITE_OTHER): Payer: BLUE CROSS/BLUE SHIELD | Admitting: Adult Health

## 2015-04-30 ENCOUNTER — Encounter: Payer: Self-pay | Admitting: Adult Health

## 2015-04-30 VITALS — BP 126/80 | HR 88 | Ht 71.0 in | Wt 244.4 lb

## 2015-04-30 DIAGNOSIS — I10 Essential (primary) hypertension: Secondary | ICD-10-CM | POA: Diagnosis not present

## 2015-04-30 DIAGNOSIS — Z0000 Encounter for general adult medical examination without abnormal findings: Secondary | ICD-10-CM

## 2015-04-30 NOTE — Addendum Note (Signed)
Addended by: Nancy Fetter on: 04/30/2015 02:02 PM   Modules accepted: Level of Service

## 2015-04-30 NOTE — Patient Instructions (Addendum)
It was great seeing you again. I am glad everything is healed up after surgery and you are feeling better.   Follow up for blood work. I will call you when I get the results.   If everything is good with the blood work, then I do not need to see you for one year.   Please let me know if you need anything.    Health Maintenance A healthy lifestyle and preventative care can promote health and wellness.  Maintain regular health, dental, and eye exams.  Eat a healthy diet. Foods like vegetables, fruits, whole grains, low-fat dairy products, and lean protein foods contain the nutrients you need and are low in calories. Decrease your intake of foods high in solid fats, added sugars, and salt. Get information about a proper diet from your health care provider, if necessary.  Regular physical exercise is one of the most important things you can do for your health. Most adults should get at least 150 minutes of moderate-intensity exercise (any activity that increases your heart rate and causes you to sweat) each week. In addition, most adults need muscle-strengthening exercises on 2 or more days a week.   Maintain a healthy weight. The body mass index (BMI) is a screening tool to identify possible weight problems. It provides an estimate of body fat based on height and weight. Your health care provider can find your BMI and can help you achieve or maintain a healthy weight. For males 20 years and older:  A BMI below 18.5 is considered underweight.  A BMI of 18.5 to 24.9 is normal.  A BMI of 25 to 29.9 is considered overweight.  A BMI of 30 and above is considered obese.  Maintain normal blood lipids and cholesterol by exercising and minimizing your intake of saturated fat. Eat a balanced diet with plenty of fruits and vegetables. Blood tests for lipids and cholesterol should begin at age 99 and be repeated every 5 years. If your lipid or cholesterol levels are high, you are over age 63, or you  are at high risk for heart disease, you may need your cholesterol levels checked more frequently.Ongoing high lipid and cholesterol levels should be treated with medicines if diet and exercise are not working.  If you smoke, find out from your health care provider how to quit. If you do not use tobacco, do not start.  Lung cancer screening is recommended for adults aged 55-80 years who are at high risk for developing lung cancer because of a history of smoking. A yearly low-dose CT scan of the lungs is recommended for people who have at least a 30-pack-year history of smoking and are current smokers or have quit within the past 15 years. A pack year of smoking is smoking an average of 1 pack of cigarettes a day for 1 year (for example, a 30-pack-year history of smoking could mean smoking 1 pack a day for 30 years or 2 packs a day for 15 years). Yearly screening should continue until the smoker has stopped smoking for at least 15 years. Yearly screening should be stopped for people who develop a health problem that would prevent them from having lung cancer treatment.  If you choose to drink alcohol, do not have more than 2 drinks per day. One drink is considered to be 12 oz (360 mL) of beer, 5 oz (150 mL) of wine, or 1.5 oz (45 mL) of liquor.  Avoid the use of street drugs. Do not share needles  with anyone. Ask for help if you need support or instructions about stopping the use of drugs.  High blood pressure causes heart disease and increases the risk of stroke. Blood pressure should be checked at least every 1-2 years. Ongoing high blood pressure should be treated with medicines if weight loss and exercise are not effective.  If you are 41-36 years old, ask your health care provider if you should take aspirin to prevent heart disease.  Diabetes screening involves taking a blood sample to check your fasting blood sugar level. This should be done once every 3 years after age 68 if you are at a normal  weight and without risk factors for diabetes. Testing should be considered at a younger age or be carried out more frequently if you are overweight and have at least 1 risk factor for diabetes.  Colorectal cancer can be detected and often prevented. Most routine colorectal cancer screening begins at the age of 11 and continues through age 16. However, your health care provider may recommend screening at an earlier age if you have risk factors for colon cancer. On a yearly basis, your health care provider may provide home test kits to check for hidden blood in the stool. A small camera at the end of a tube may be used to directly examine the colon (sigmoidoscopy or colonoscopy) to detect the earliest forms of colorectal cancer. Talk to your health care provider about this at age 56 when routine screening begins. A direct exam of the colon should be repeated every 5-10 years through age 52, unless early forms of precancerous polyps or small growths are found.  People who are at an increased risk for hepatitis B should be screened for this virus. You are considered at high risk for hepatitis B if:  You were born in a country where hepatitis B occurs often. Talk with your health care provider about which countries are considered high risk.  Your parents were born in a high-risk country and you have not received a shot to protect against hepatitis B (hepatitis B vaccine).  You have HIV or AIDS.  You use needles to inject street drugs.  You live with, or have sex with, someone who has hepatitis B.  You are a man who has sex with other men (MSM).  You get hemodialysis treatment.  You take certain medicines for conditions like cancer, organ transplantation, and autoimmune conditions.  Hepatitis C blood testing is recommended for all people born from [birth year] through 1965 and any individual with known risk factors for hepatitis C.  Healthy men should no longer receive prostate-specific antigen (PSA) blood  tests as part of routine cancer screening. Talk to your health care provider about prostate cancer screening.  Testicular cancer screening is not recommended for adolescents or adult males who have no symptoms. Screening includes self-exam, a health care provider exam, and other screening tests. Consult with your health care provider about any symptoms you have or any concerns you have about testicular cancer.  Practice safe sex. Use condoms and avoid high-risk sexual practices to reduce the spread of sexually transmitted infections (STIs).  You should be screened for STIs, including gonorrhea and chlamydia if:  You are sexually active and are younger than 24 years.  You are older than 24 years, and your health care provider tells you that you are at risk for this type of infection.  Your sexual activity has changed since you were last screened, and you are at an increased risk  for chlamydia or gonorrhea. Ask your health care provider if you are at risk.  If you are at risk of being infected with HIV, it is recommended that you take a prescription medicine daily to prevent HIV infection. This is called pre-exposure prophylaxis (PrEP). You are considered at risk if:  You are a man who has sex with other men (MSM).  You are a heterosexual man who is sexually active with multiple partners.  You take drugs by injection.  You are sexually active with a partner who has HIV.  Talk with your health care provider about whether you are at high risk of being infected with HIV. If you choose to begin PrEP, you should first be tested for HIV. You should then be tested every 3 months for as long as you are taking PrEP.  Use sunscreen. Apply sunscreen liberally and repeatedly throughout the day. You should seek shade when your shadow is shorter than you. Protect yourself by wearing long sleeves, pants, a wide-brimmed hat, and sunglasses year round whenever you are outdoors.  Tell your health care  provider of new moles or changes in moles, especially if there is a change in shape or color. Also, tell your health care provider if a mole is larger than the size of a pencil eraser.  A one-time screening for abdominal aortic aneurysm (AAA) and surgical repair of large AAAs by ultrasound is recommended for men aged 65-75 years who are current or former smokers.  Stay current with your vaccines (immunizations). Document Released: 01/23/2008 Document Revised: 08/01/2013 Document Reviewed: 12/22/2010 [facility] Patient Information 2015 [place], [place]. This information is not intended to replace advice given to you by your health care provider. Make sure you discuss any questions you have with your health care provider.

## 2015-04-30 NOTE — Progress Notes (Signed)
HPI:  Jake Hoffman is here for his complete physical. He is a very healthy 29 year old AA male who  has a past medical history of Hypertension; LVH (left ventricular hypertrophy); and Hernia of abdominal cavity.   Last PCP and physical:04/2013 Immunizations:UTD Diet:Eats healthy Exercise:He goes to the gym or plays basketball and football. He also lifts furniture all day    Has the following chronic problems that require follow up and concerns today:  HTN  - He is controlled on lisinopril -HCTZ. Has been taking this medication for 4 years. Has no problems with this medication.   Is no longer followed by Cardiology for his LVH  ROS negative for unless reported above: fevers, chills,feeling poorly, unintentional weight loss, hearing or vision loss, chest pain, palpitations, leg claudication, struggling to breath,Not feeling congested in the chest, no orthopenia, no cough,no wheezing, normal appetite, no soft tissue swelling, no hemoptysis, melena, hematochezia, hematuria, falls, loc, si, or thoughts of self harm.   Past Medical History  Diagnosis Date  . Hypertension   . LVH (left ventricular hypertrophy)   . Hernia of abdominal cavity     Past Surgical History  Procedure Laterality Date  . Laparoscopic appendectomy N/A 04/13/2015    Procedure: APPENDECTOMY LAPAROSCOPIC;  Surgeon: Darnell Level, MD;  Location: [facility];  Service: General;  Laterality: N/A;    Family History  Problem Relation Age of Onset  . Hyperlipidemia Mother   . Hypertension Mother   . Arthritis Mother   . Hypertension Father     Social History   Social History  . Marital Status: Single    Spouse Name: N/A  . Number of Children: N/A  . Years of Education: N/A   Social History Main Topics  . Smoking status: Never Smoker   . Smokeless tobacco: Not on file  . Alcohol Use: Yes  . Drug Use: No  . Sexual Activity: Not on file   Other Topics Concern  . Not on file   Social History Narrative      Current outpatient prescriptions:  .  HYDROcodone-acetaminophen (NORCO/VICODIN) 5-325 MG per tablet, Take 1-2 tablets by mouth every 4 (four) hours as needed for moderate pain., Disp: 30 tablet, Rfl: 0 .  ibuprofen (ADVIL,MOTRIN) 800 MG tablet, Take 1 tablet (800 mg total) by mouth every 8 (eight) hours as needed. (Patient not taking: Reported on 04/13/2015), Disp: 30 tablet, Rfl: 0 .  lisinopril-hydrochlorothiazide (ZESTORETIC) 20-12.5 MG per tablet, Take 1 tablet by mouth daily., Disp: 90 tablet, Rfl: 3  EXAM:  There were no vitals filed for this visit.  There is no weight on file to calculate BMI.  GENERAL: vitals reviewed and listed above, alert, oriented, appears well hydrated and in no acute distress.   HEENT: atraumatic, conjunttiva clear, no obvious abnormalities on inspection of external nose and ears  NECK: Neck is soft and supple without masses, no adenopathy or thyromegaly, trachea midline, no JVD. Normal range of motion.   LUNGS: clear to auscultation bilaterally, no wheezes, rales or rhonchi, good air movement  CV: Regular rate and rhythm, normal S1/S2, no audible murmurs, gallops, or rubs. No carotid bruit and no peripheral edema.   MS: moves all extremities without noticeable abnormality. No edema noted  Abd: soft/nontender/nondistended/normal bowel sounds. Small well healed scars on abdomen from recent appendectomy.   Skin: warm and dry, no rash   Extremities: No clubbing, cyanosis, or edema. Capillary refill is WNL. Pulses intact bilaterally in upper and lower extremities.  Neuro: CN II-XII intact, sensation and reflexes normal throughout, 5/5 muscle strength in bilateral upper and lower extremities. Normal finger to nose. Normal rapid alternating movements.  PSYCH: pleasant and cooperative, no obvious depression or anxiety  ASSESSMENT AND PLAN:  1. Routine general medical examination at a health care facility - Follow up in one year for CPE or sooner if  needed - Continue to eat healthy and exercise - Hepatic function panel; Future - Lipid panel; Future - CBC with Differential/Platelet; Future  2. Essential hypertension - No change in medications.  - Continue to monitor at home.  - Hemoglobin A1c; Future - Hepatic function panel; Future - Lipid panel; Future - POCT urinalysis dipstick; Future - TSH; Future - CBC with Differential/Platelet; Future   -We reviewed the PMH, PSH, FH, SH, Meds and Allergies. -We provided refills for any medications we will prescribe as needed. -We addressed current concerns per orders and patient instructions. -We have asked for records for pertinent exams, studies, vaccines and notes from previous providers. -We have advised patient to follow up per instructions below.   -Patient advised to return or notify a [facility] immediately if symptoms worsen or persist or new concerns arise.    Shirline Frees, AGNP

## 2015-05-01 ENCOUNTER — Ambulatory Visit: Payer: BLUE CROSS/BLUE SHIELD | Admitting: Adult Health

## 2015-05-03 ENCOUNTER — Other Ambulatory Visit: Payer: BLUE CROSS/BLUE SHIELD

## 2015-06-05 ENCOUNTER — Ambulatory Visit: Payer: BLUE CROSS/BLUE SHIELD | Admitting: Adult Health

## 2015-06-10 ENCOUNTER — Encounter: Payer: Self-pay | Admitting: Adult Health

## 2015-06-10 ENCOUNTER — Ambulatory Visit (INDEPENDENT_AMBULATORY_CARE_PROVIDER_SITE_OTHER): Payer: BLUE CROSS/BLUE SHIELD | Admitting: Adult Health

## 2015-06-10 VITALS — BP 118/90 | HR 88 | Temp 98.5°F | Wt 247.2 lb

## 2015-06-10 DIAGNOSIS — J069 Acute upper respiratory infection, unspecified: Secondary | ICD-10-CM

## 2015-06-10 DIAGNOSIS — Z23 Encounter for immunization: Secondary | ICD-10-CM

## 2015-06-10 MED ORDER — PREDNISONE 20 MG PO TABS: 20.0000 mg | ORAL_TABLET | Freq: Every day | ORAL | Status: DC

## 2015-06-10 MED ORDER — HYDROCODONE-HOMATROPINE 5-1.5 MG/5ML PO SYRP: 5.0000 mL | ORAL_SOLUTION | Freq: Three times a day (TID) | ORAL | Status: DC | PRN

## 2015-06-10 NOTE — Progress Notes (Signed)
   Subjective:    Patient ID: Jake Hoffman, male    DOB: [birth year], 29 y.o.   MRN: [id number]  HPI 29 year old healthy male for upper respiratory infection type symptoms. His symptoms started 4-5 days ago. He started with a sore throat and headache which has almost resolved. He only has a sore throat in the morning and his headache is gone. He currently has a cough with occasional sputum production and Rhinitis His stuff nose is getting better.   He denies fevers, n/v/d.   He has tried multiple OTC remedies which has not helped. He was prescribed a 7 day course of Clindamycin by an ENT for his sore throat, which he took 3 days of and then quit because " it wasn't working".   He is having a baby in 3 days.     Review of Systems  Constitutional: Negative.   HENT: Positive for rhinorrhea and sore throat. Negative for congestion, ear discharge, ear pain, postnasal drip, sinus pressure, sneezing and voice change.   Respiratory: Positive for cough. Negative for chest tightness, shortness of breath and wheezing.   Cardiovascular: Negative.   Musculoskeletal: Negative.   All other systems reviewed and are negative.      Objective:   Physical Exam  Constitutional: He is oriented to person, place, and time. He appears well-developed and well-nourished. No distress.  HENT:  Head: Normocephalic and atraumatic.  Right Ear: External ear normal.  Left Ear: External ear normal.  Nose: Nose normal.  Mouth/Throat: Oropharynx is clear and moist. No oropharyngeal exudate.  TM's visualized, no signs of infection Enlarged tonsils from stones. Is having them removed in December  Eyes: Conjunctivae and EOM are normal. Pupils are equal, round, and reactive to light. Right eye exhibits no discharge. Left eye exhibits no discharge.  Cardiovascular: Normal rate, regular rhythm, normal heart sounds and intact distal pulses.  Exam reveals no gallop and no friction rub.   No murmur  heard. Pulmonary/Chest: Effort normal and breath sounds normal. No respiratory distress. He has no wheezes. He has no rales. He exhibits no tenderness.  Musculoskeletal: Normal range of motion. He exhibits no edema or tenderness.  Lymphadenopathy:    He has no cervical adenopathy.  Neurological: He is alert and oriented to person, place, and time.  Skin: Skin is warm and dry. No rash noted. He is not diaphoretic. No erythema. No pallor.  Psychiatric: He has a normal mood and affect. His behavior is normal. Judgment and thought content normal.  Nursing note and vitals reviewed.      Assessment & Plan:  1. Acute upper respiratory infection - Likely viral  - HYDROcodone-homatropine (HYCODAN) 5-1.5 MG/5ML syrup; Take 5 mLs by mouth every 8 (eight) hours as needed for cough.  Dispense: 120 mL; Refill: 0 - predniSONE (DELTASONE) 20 MG tablet; Take 1 tablet (20 mg total) by mouth daily with breakfast.  Dispense: 9 tablet; Refill: 0 - follow up if no improvement in 2-3 days.   2. Encounter for immunization Flu given   3. Need for Tdap vaccination Tdap vaccine greater than or equal to 7yo IM

## 2015-06-10 NOTE — Progress Notes (Signed)
Pre visit review using our clinic review tool, if applicable. No additional management support is needed unless otherwise documented below in the visit note. 

## 2015-06-10 NOTE — Patient Instructions (Signed)
It was great seeing you again!  Your exam is consistent with an upper respiratory infection. More than likely this is a viral infection.   Take the prednisone as directed   Day 1 40 mg Day 2 40 mg Day 3 40 mg Day 4 20 mg Day 5 20 mg Day 6 20 mg   Use the cough syrup at night as it will make you sleepy.

## 2015-06-13 ENCOUNTER — Other Ambulatory Visit: Payer: Self-pay | Admitting: Adult Health

## 2015-06-13 ENCOUNTER — Telehealth: Payer: Self-pay | Admitting: Adult Health

## 2015-06-13 MED ORDER — DOXYCYCLINE HYCLATE 100 MG PO CAPS: 100.0000 mg | ORAL_CAPSULE | Freq: Two times a day (BID) | ORAL | Status: DC

## 2015-06-13 NOTE — Telephone Encounter (Signed)
pls advise

## 2015-06-13 NOTE — Telephone Encounter (Signed)
Prescription for Doxy 100mg  BID for 7 days sent to pharmacy

## 2015-06-13 NOTE — Telephone Encounter (Signed)
Wife states pt is not any better and is hoping for a abx to help with his bronchitis [facility]

## 2015-06-14 NOTE — Telephone Encounter (Signed)
Called and spoke with pt and pt has picked up rx. Advised pt to call the office if further assistance is needed.

## 2015-10-01 ENCOUNTER — Telehealth: Payer: Self-pay

## 2015-10-01 MED ORDER — OSELTAMIVIR PHOSPHATE 75 MG PO CAPS: 75.0000 mg | ORAL_CAPSULE | Freq: Every day | ORAL | Status: DC

## 2015-10-01 NOTE — Telephone Encounter (Signed)
Pt's son has the flu and due to having a 65 month old in the home. Ok per [place] to send Tamiflu 75 bid x 7 days.

## 2016-01-28 ENCOUNTER — Encounter: Payer: Self-pay | Admitting: Family Medicine

## 2016-01-28 ENCOUNTER — Ambulatory Visit (INDEPENDENT_AMBULATORY_CARE_PROVIDER_SITE_OTHER): Payer: BLUE CROSS/BLUE SHIELD | Admitting: Family Medicine

## 2016-01-28 VITALS — BP 122/90 | HR 83 | Temp 98.1°F | Ht 71.0 in | Wt 246.4 lb

## 2016-01-28 DIAGNOSIS — R319 Hematuria, unspecified: Secondary | ICD-10-CM | POA: Diagnosis not present

## 2016-01-28 LAB — POCT URINALYSIS DIPSTICK
Bilirubin, UA: NEGATIVE
Glucose, UA: NEGATIVE
Ketones, UA: NEGATIVE
Leukocytes, UA: NEGATIVE
NITRITE UA: NEGATIVE
PROTEIN UA: NEGATIVE
SPEC GRAV UA: 1.015
UROBILINOGEN UA: 0.2
pH, UA: 5

## 2016-01-28 LAB — URINALYSIS, MICROSCOPIC ONLY

## 2016-01-28 NOTE — Progress Notes (Signed)
  HPI:  Acute visit for:  Hematuria: -noticed tr amount blood when urinated yesterday, had mild dysuria at penile tip as well with 2 voids -resolved today -does heavy lifting at work and started lifting weights at home recently -hx kidney stones and ? URI in the past -denies: pus, multiple sexual partners or concern for UTI, ejaculatory symptoms, fevers, malaise, flank pain or abd pain   ROS: See pertinent positives and negatives per HPI.  Past Medical History  Diagnosis Date  . Hypertension   . LVH (left ventricular hypertrophy)   . Hernia of abdominal cavity     Past Surgical History  Procedure Laterality Date  . Laparoscopic appendectomy N/A 04/13/2015    Procedure: APPENDECTOMY LAPAROSCOPIC;  Surgeon: Darnell Levelodd Gerkin, MD;  Location: [facility];  Service: General;  Laterality: N/A;    Family History  Problem Relation Age of Onset  . Hyperlipidemia Mother   . Hypertension Mother   . Arthritis Mother   . Hypertension Father   . Cancer Father     unknown ? lung    Social History   Social History  . Marital Status: Single    Spouse Name: N/A  . Number of Children: N/A  . Years of Education: N/A   Social History Main Topics  . Smoking status: Never Smoker   . Smokeless tobacco: None  . Alcohol Use: 0.0 oz/week    0 Standard drinks or equivalent per week     Comment: socially  . Drug Use: No  . Sexual Activity: Not Asked   Other Topics Concern  . None   Social History Narrative   Married    One children and they are pregnant with their second   Production designer, theatre/television/filmManager of moving company         Current outpatient prescriptions:  .  ibuprofen (ADVIL,MOTRIN) 800 MG tablet, Take 1 tablet (800 mg total) by mouth every 8 (eight) hours as needed., Disp: 30 tablet, Rfl: 0 .  lisinopril-hydrochlorothiazide (ZESTORETIC) 20-12.5 MG per tablet, Take 1 tablet by mouth daily., Disp: 90 tablet, Rfl: 3  EXAM:  Filed Vitals:   01/28/16 0958  BP: 122/90  Pulse: 83  Temp: 98.1 F (36.7 C)     Body mass index is 34.38 kg/(m^2).  GENERAL: vitals reviewed and listed above, alert, oriented, appears well hydrated and in no acute distress  HEENT: atraumatic, conjunttiva clear, no obvious abnormalities on inspection of external nose and ears  NECK: no obvious masses on inspection  LUNGS: clear to auscultation bilaterally, no wheezes, rales or rhonchi, good air movement  CV: HRRR, no peripheral edema  GU: normal exam penus and external genitalia  MS: moves all extremities without noticeable abnormality  PSYCH: pleasant and cooperative, no obvious depression or anxiety  ASSESSMENT AND PLAN:  Discussed the following assessment and plan:  Hematuria - Plan: POC Urinalysis Dipstick, Urine Microscopic Only, Culture, Urine  -we discussed possible serious and likely etiologies, workup and treatment, treatment risks and return precautions -udip with blood, micro, GC/Chlam and culture pending -regardless, repeat urine studies in 1 month -of course, we advised Delaine  to return or notify a doctor immediately if symptoms worsen or persist or new concerns arise.  .  -Patient advised to return or notify a doctor immediately if symptoms worsen or persist or new concerns arise.  There are no Patient Instructions on file for this visit.   Kriste BasqueKIM, Lollie Gunner R.

## 2016-01-28 NOTE — Progress Notes (Signed)
Pre visit review using our clinic review tool, if applicable. No additional management support is needed unless otherwise documented below in the visit note. 

## 2016-01-30 LAB — URINE CULTURE
Colony Count: NO GROWTH
ORGANISM ID, BACTERIA: NO GROWTH

## 2016-02-03 ENCOUNTER — Other Ambulatory Visit: Payer: Self-pay | Admitting: Adult Health

## 2016-02-03 MED ORDER — LISINOPRIL-HYDROCHLOROTHIAZIDE 20-12.5 MG PO TABS: 1.0000 | ORAL_TABLET | Freq: Every day | ORAL | Status: DC

## 2016-02-03 NOTE — Telephone Encounter (Signed)
[facility]

## 2016-02-03 NOTE — Telephone Encounter (Signed)
Pt need refill on lisinopril-hydrochlorothiazide.  Pharm:  [facility] [address] [place], [place]

## 2016-02-03 NOTE — Addendum Note (Signed)
Addended by: Johnella MoloneyFUNDERBURK, JO A on: 02/03/2016 03:22 PM   Modules accepted: Orders

## 2016-02-04 ENCOUNTER — Other Ambulatory Visit: Payer: Self-pay | Admitting: General Practice

## 2016-02-04 MED ORDER — LISINOPRIL-HYDROCHLOROTHIAZIDE 20-12.5 MG PO TABS: 1.0000 | ORAL_TABLET | Freq: Every day | ORAL | Status: DC

## 2016-02-28 ENCOUNTER — Other Ambulatory Visit: Payer: BLUE CROSS/BLUE SHIELD

## 2017-01-03 ENCOUNTER — Emergency Department (HOSPITAL_COMMUNITY)
Admission: EM | Admit: 2017-01-03 | Discharge: 2017-01-04 | Disposition: A | Discharge status: Home / Self Care | Payer: PRIVATE HEALTH INSURANCE | Attending: Emergency Medicine | Admitting: Emergency Medicine

## 2017-01-03 ENCOUNTER — Encounter (HOSPITAL_COMMUNITY): Payer: Self-pay | Admitting: Nurse Practitioner

## 2017-01-03 DIAGNOSIS — M6282 Rhabdomyolysis: Secondary | ICD-10-CM | POA: Diagnosis not present

## 2017-01-03 DIAGNOSIS — Z79899 Other long term (current) drug therapy: Secondary | ICD-10-CM | POA: Insufficient documentation

## 2017-01-03 DIAGNOSIS — I10 Essential (primary) hypertension: Secondary | ICD-10-CM | POA: Diagnosis not present

## 2017-01-03 DIAGNOSIS — M609 Myositis, unspecified: Secondary | ICD-10-CM | POA: Diagnosis not present

## 2017-01-03 DIAGNOSIS — M60009 Infective myositis, unspecified site: Secondary | ICD-10-CM

## 2017-01-03 DIAGNOSIS — B9789 Other viral agents as the cause of diseases classified elsewhere: Secondary | ICD-10-CM

## 2017-01-03 DIAGNOSIS — R1111 Vomiting without nausea: Secondary | ICD-10-CM | POA: Diagnosis present

## 2017-01-03 LAB — CK
Total CK: 1533 U/L — ABNORMAL HIGH (ref 49–397)
Total CK: 1446 U/L — ABNORMAL HIGH (ref 49–397)

## 2017-01-03 LAB — CBC WITH DIFFERENTIAL/PLATELET
BASOS PCT: 0 %
Basophils Absolute: 0 10*3/uL (ref 0.0–0.1)
EOS ABS: 0 10*3/uL (ref 0.0–0.7)
Eosinophils Relative: 0 %
HEMATOCRIT: 47.6 % (ref 39.0–52.0)
HEMOGLOBIN: 16.7 g/dL (ref 13.0–17.0)
LYMPHS ABS: 3.5 10*3/uL (ref 0.7–4.0)
Lymphocytes Relative: 35 %
MCH: 33.7 pg (ref 26.0–34.0)
MCHC: 35.1 g/dL (ref 30.0–36.0)
MCV: 96 fL (ref 78.0–100.0)
MONO ABS: 0.9 10*3/uL (ref 0.1–1.0)
MONOS PCT: 9 %
NEUTROS ABS: 5.5 10*3/uL (ref 1.7–7.7)
NEUTROS PCT: 56 %
Platelets: 312 10*3/uL (ref 150–400)
RBC: 4.96 MIL/uL (ref 4.22–5.81)
RDW: 11.5 % (ref 11.5–15.5)
WBC: 9.8 10*3/uL (ref 4.0–10.5)

## 2017-01-03 LAB — COMPREHENSIVE METABOLIC PANEL
ALT: 39 U/L (ref 17–63)
ANION GAP: 11 (ref 5–15)
AST: 39 U/L (ref 15–41)
Albumin: 5.1 g/dL — ABNORMAL HIGH (ref 3.5–5.0)
Alkaline Phosphatase: 90 U/L (ref 38–126)
BILIRUBIN TOTAL: 0.9 mg/dL (ref 0.3–1.2)
BUN: 21 mg/dL — ABNORMAL HIGH (ref 6–20)
CHLORIDE: 98 mmol/L — AB (ref 101–111)
CO2: 26 mmol/L (ref 22–32)
Calcium: 9.5 mg/dL (ref 8.9–10.3)
Creatinine, Ser: 1.64 mg/dL — ABNORMAL HIGH (ref 0.61–1.24)
GFR, EST NON AFRICAN AMERICAN: 54 mL/min — AB (ref >60)
Glucose, Bld: 99 mg/dL (ref 65–99)
POTASSIUM: 4.2 mmol/L (ref 3.5–5.1)
Sodium: 135 mmol/L (ref 135–145)
TOTAL PROTEIN: 9.5 g/dL — AB (ref 6.5–8.1)

## 2017-01-03 LAB — URINALYSIS, ROUTINE W REFLEX MICROSCOPIC
BILIRUBIN URINE: NEGATIVE
Bacteria, UA: NONE SEEN
GLUCOSE, UA: NEGATIVE mg/dL
Hgb urine dipstick: NEGATIVE
KETONES UR: NEGATIVE mg/dL
Leukocytes, UA: NEGATIVE
NITRITE: NEGATIVE
PH: 5 (ref 5.0–8.0)
Protein, ur: 30 mg/dL — AB
SPECIFIC GRAVITY, URINE: 1.023 (ref 1.005–1.030)

## 2017-01-03 LAB — BASIC METABOLIC PANEL
Anion gap: 6 (ref 5–15)
BUN: 19 mg/dL (ref 6–20)
CHLORIDE: 101 mmol/L (ref 101–111)
CO2: 29 mmol/L (ref 22–32)
CREATININE: 1.42 mg/dL — AB (ref 0.61–1.24)
Calcium: 8.5 mg/dL — ABNORMAL LOW (ref 8.9–10.3)
GFR calc Af Amer: >60 mL/min (ref >60)
GFR calc non Af Amer: >60 mL/min (ref >60)
GLUCOSE: 132 mg/dL — AB (ref 65–99)
POTASSIUM: 3.6 mmol/L (ref 3.5–5.1)
Sodium: 136 mmol/L (ref 135–145)

## 2017-01-03 LAB — LIPASE, BLOOD: LIPASE: 29 U/L (ref 11–51)

## 2017-01-03 MED ORDER — SODIUM CHLORIDE 0.9 % IV BOLUS (SEPSIS)
1000.0000 mL | Freq: Once | INTRAVENOUS | Status: AC
  Administered 2017-01-03: 1000 mL via INTRAVENOUS

## 2017-01-03 NOTE — ED Provider Notes (Signed)
[facility]ER[facility]ENCY DEPT Provider Note   CSN: [id number] Arrival date & time: 01/03/17  1709     History   Chief Complaint Chief Complaint  Patient presents with  . Emesis  . Generalized Body Aches    HPI Jake Hoffman is a 31 y.o. male.  The history is provided by the patient.  Emesis   This is a new problem. The current episode started 6 to 12 hours ago. The problem occurs 2 to 4 times per day. The problem has been resolved. The emesis has an appearance of stomach contents. There has been no fever. Associated symptoms include abdominal pain (stomach cramping), cough, myalgias (due muscle cramping), sweats and URI. Pertinent negatives include no chills, no diarrhea, no fever and no headaches. Risk factors include ill contacts (Children are having URI symptoms as well).   Muscular cramps/spasm have been on going for several years, but for the past 3 days they have been more frequent.   Past Medical History:  Diagnosis Date  . Hernia of abdominal cavity   . Hypertension   . LVH (left ventricular hypertrophy)     Patient Active Problem List   Diagnosis Date Noted  . Acute appendicitis 04/13/2015  . Hypertriglyceridemia 01/17/2014  . HTN (hypertension) 03/06/2013    Past Surgical History:  Procedure Laterality Date  . LAPAROSCOPIC APPENDECTOMY N/A 04/13/2015   Procedure: APPENDECTOMY LAPAROSCOPIC;  Surgeon: Darnell Level, MD;  Location: [facility];  Service: General;  Laterality: N/A;       Home Medications    Prior to Admission medications   Medication Sig Start Date End Date Taking? Authorizing Provider  acetaminophen (TYLENOL) 500 MG tablet Take 1,000 mg by mouth every 6 (six) hours as needed.   Yes [provider]  diphenhydrAMINE (BENADRYL) 25 MG tablet Take 25 mg by mouth every 6 (six) hours as needed for allergies.   Yes [provider]  DM-Phenylephrine-Acetaminophen (ALKA-SELTZER PLS SINUS & COUGH) 10-5-325 MG CAPS Take 2 tablets by mouth daily  as needed (cold symptoms).   Yes [provider]  fluticasone (FLONASE) 50 MCG/ACT nasal spray Place 1 spray into both nostrils 2 (two) times daily. 11/13/16  Yes [provider]  ibuprofen (ADVIL,MOTRIN) 200 MG tablet Take 600 mg by mouth every 6 (six) hours as needed for mild pain.   Yes [provider]  ibuprofen (ADVIL,MOTRIN) 800 MG tablet Take 1 tablet (800 mg total) by mouth every 8 (eight) hours as needed. 01/17/14  Yes Worthy Rancher B, FNP  lisinopril-hydrochlorothiazide (ZESTORETIC) 20-12.5 MG tablet Take 1 tablet by mouth daily. 02/04/16  Yes Nafziger, Kandee Keen, NP  Phenylephrine-Pheniramine-DM [facility] COLD & COUGH) 05-30-19 MG PACK Take 1 packet by mouth daily as needed (cold symptoms).   Yes [provider]  pseudoephedrine (SUDAFED) 30 MG tablet Take 30 mg by mouth every 4 (four) hours as needed for congestion.   Yes [provider]  pseudoephedrine-guaifenesin (MUCINEX D) 60-600 MG 12 hr tablet Take 1 tablet by mouth every 12 (twelve) hours.   Yes [provider]    Family History Family History  Problem Relation Age of Onset  . Hypertension Father   . Cancer Father        unknown ? lung  . Hyperlipidemia Mother   . Hypertension Mother   . Arthritis Mother     Social History Social History  Substance Use Topics  . Smoking status: Never Smoker  . Smokeless tobacco: Not on file  . Alcohol use 0.0 oz/week  Comment: socially     Allergies   Patient has no known allergies.   Review of Systems Review of Systems  Constitutional: Negative for chills and fever.  Respiratory: Positive for cough.   Gastrointestinal: Positive for abdominal pain (stomach cramping) and vomiting. Negative for diarrhea.  Musculoskeletal: Positive for myalgias (due muscle cramping).  Neurological: Negative for headaches.  All other systems are reviewed and are negative for acute change except as noted in the HPI    Physical Exam Updated  Vital Signs BP 131/79 (BP Location: Left Arm)   Pulse 85   Temp 98.8 F (37.1 C) (Oral)   Resp 20   Wt 113.7 kg (250 lb 9.6 oz)   SpO2 99%   BMI 34.95 kg/m   Physical Exam  Constitutional: He is oriented to person, place, and time. He appears well-developed and well-nourished. No distress.  HENT:  Head: Normocephalic and atraumatic.  Nose: Nose normal.  Eyes: Conjunctivae and EOM are normal. Pupils are equal, round, and reactive to light. Right eye exhibits no discharge. Left eye exhibits no discharge. No scleral icterus.  Neck: Normal range of motion. Neck supple.  Cardiovascular: Normal rate and regular rhythm.  Exam reveals no gallop and no friction rub.   No murmur heard. Pulmonary/Chest: Effort normal and breath sounds normal. No stridor. No respiratory distress. He has no rales.  Abdominal: Soft. He exhibits no distension. There is no tenderness.  Musculoskeletal: He exhibits no edema or tenderness.  Neurological: He is alert and oriented to person, place, and time.  Skin: Skin is warm and dry. No rash noted. He is not diaphoretic. No erythema.  Psychiatric: He has a normal mood and affect.  Vitals reviewed.    ED Treatments / Results  Labs (all labs ordered are listed, but only abnormal results are displayed) Labs Reviewed  COMPREHENSIVE METABOLIC PANEL - Abnormal; Notable for the following:       Result Value   Chloride 98 (*)    BUN 21 (*)    Creatinine, Ser 1.64 (*)    Total Protein 9.5 (*)    Albumin 5.1 (*)    GFR calc non Af Amer 54 (*)    All other components within normal limits  URINALYSIS, ROUTINE W REFLEX MICROSCOPIC - Abnormal; Notable for the following:    Protein, ur 30 (*)    Squamous Epithelial / LPF 0-5 (*)    All other components within normal limits  CK - Abnormal; Notable for the following:    Total CK 1,533 (*)    All other components within normal limits  BASIC METABOLIC PANEL - Abnormal; Notable for the following:    Glucose, Bld 132  (*)    Creatinine, Ser 1.42 (*)    Calcium 8.5 (*)    All other components within normal limits  CK - Abnormal; Notable for the following:    Total CK 1,446 (*)    All other components within normal limits  LIPASE, BLOOD  CBC WITH DIFFERENTIAL/PLATELET    EKG  EKG Interpretation None       Radiology No results found.  Procedures Procedures (including critical care time)  Medications Ordered in ED Medications  sodium chloride 0.9 % bolus 1,000 mL (0 mLs Intravenous Stopped 01/03/17 2121)  sodium chloride 0.9 % bolus 1,000 mL (0 mLs Intravenous Stopped 01/03/17 2234)     Initial Impression / Assessment and Plan / ED Course  I have reviewed the triage vital signs and the nursing notes.  Pertinent  labs & imaging results that were available during my care of the patient were reviewed by me and considered in my medical decision making (see chart for details).     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 The patient does have acute renal insufficiency. CK 1533. Patient is able to tolerate by mouth. Given oral hydration as well as IV hydration. Repeat renal function and CK showed improvement. Feel that the patient is appropriate for discharge with close PCP follow-up.    Final Clinical Impressions(s) / ED Diagnoses   Final diagnoses:  Viral myositis  Non-traumatic rhabdomyolysis   Disposition: Discharge  Condition: Good  I have discussed the results, Dx and Tx plan with the patient who expressed understanding and agree(s) with the plan. Discharge instructions discussed at great length. The patient was given strict return precautions who verbalized understanding of the instructions. No further questions at time of discharge.    New Prescriptions   No medications on file    Follow Up: Roger KillWilliams, Breejante J, PA-C [address] [place] [id number] [phone]  Schedule an appointment as soon as possible for a visit    in 3-5 days, For close follow up      Nira Connardama, Pedro Eduardo, MD 01/04/17 0006

## 2017-01-03 NOTE — ED Triage Notes (Signed)
Pt states since 3 days ago, he has been constantly throwing up, having severe body crumps, sweats, chills and malaise. Denies any other symptoms.

## 2017-03-02 ENCOUNTER — Telehealth: Payer: Self-pay | Admitting: *Deleted

## 2017-03-02 NOTE — Telephone Encounter (Signed)
-----   Message from Terressa KoyanagiHannah R Kim, DO sent at 02/24/2017 10:35 AM EDT ----- Notice it looks like he is going to another PCP now. Can you confirm and update PCP if so? Hope they are well! Also let him know advise he should have urine checked with PCP given hx hematuria. Thanks. ----- Message ----- From: SYSTEM Sent: 02/07/2017  12:06 AM To: Terressa KoyanagiHannah R Kim, DO

## 2017-03-02 NOTE — Telephone Encounter (Signed)
I left a detailed message with the information below at the pts cell number. 

## 2017-03-22 IMAGING — CR DG CHEST 2V
2 series · 2 of 2 positions shown · non-contrast
Comparison: None.

CLINICAL DATA: Epigastric pain, chest pain, shortness of breath

EXAM:
CHEST  2 VIEW

[w chest pa]
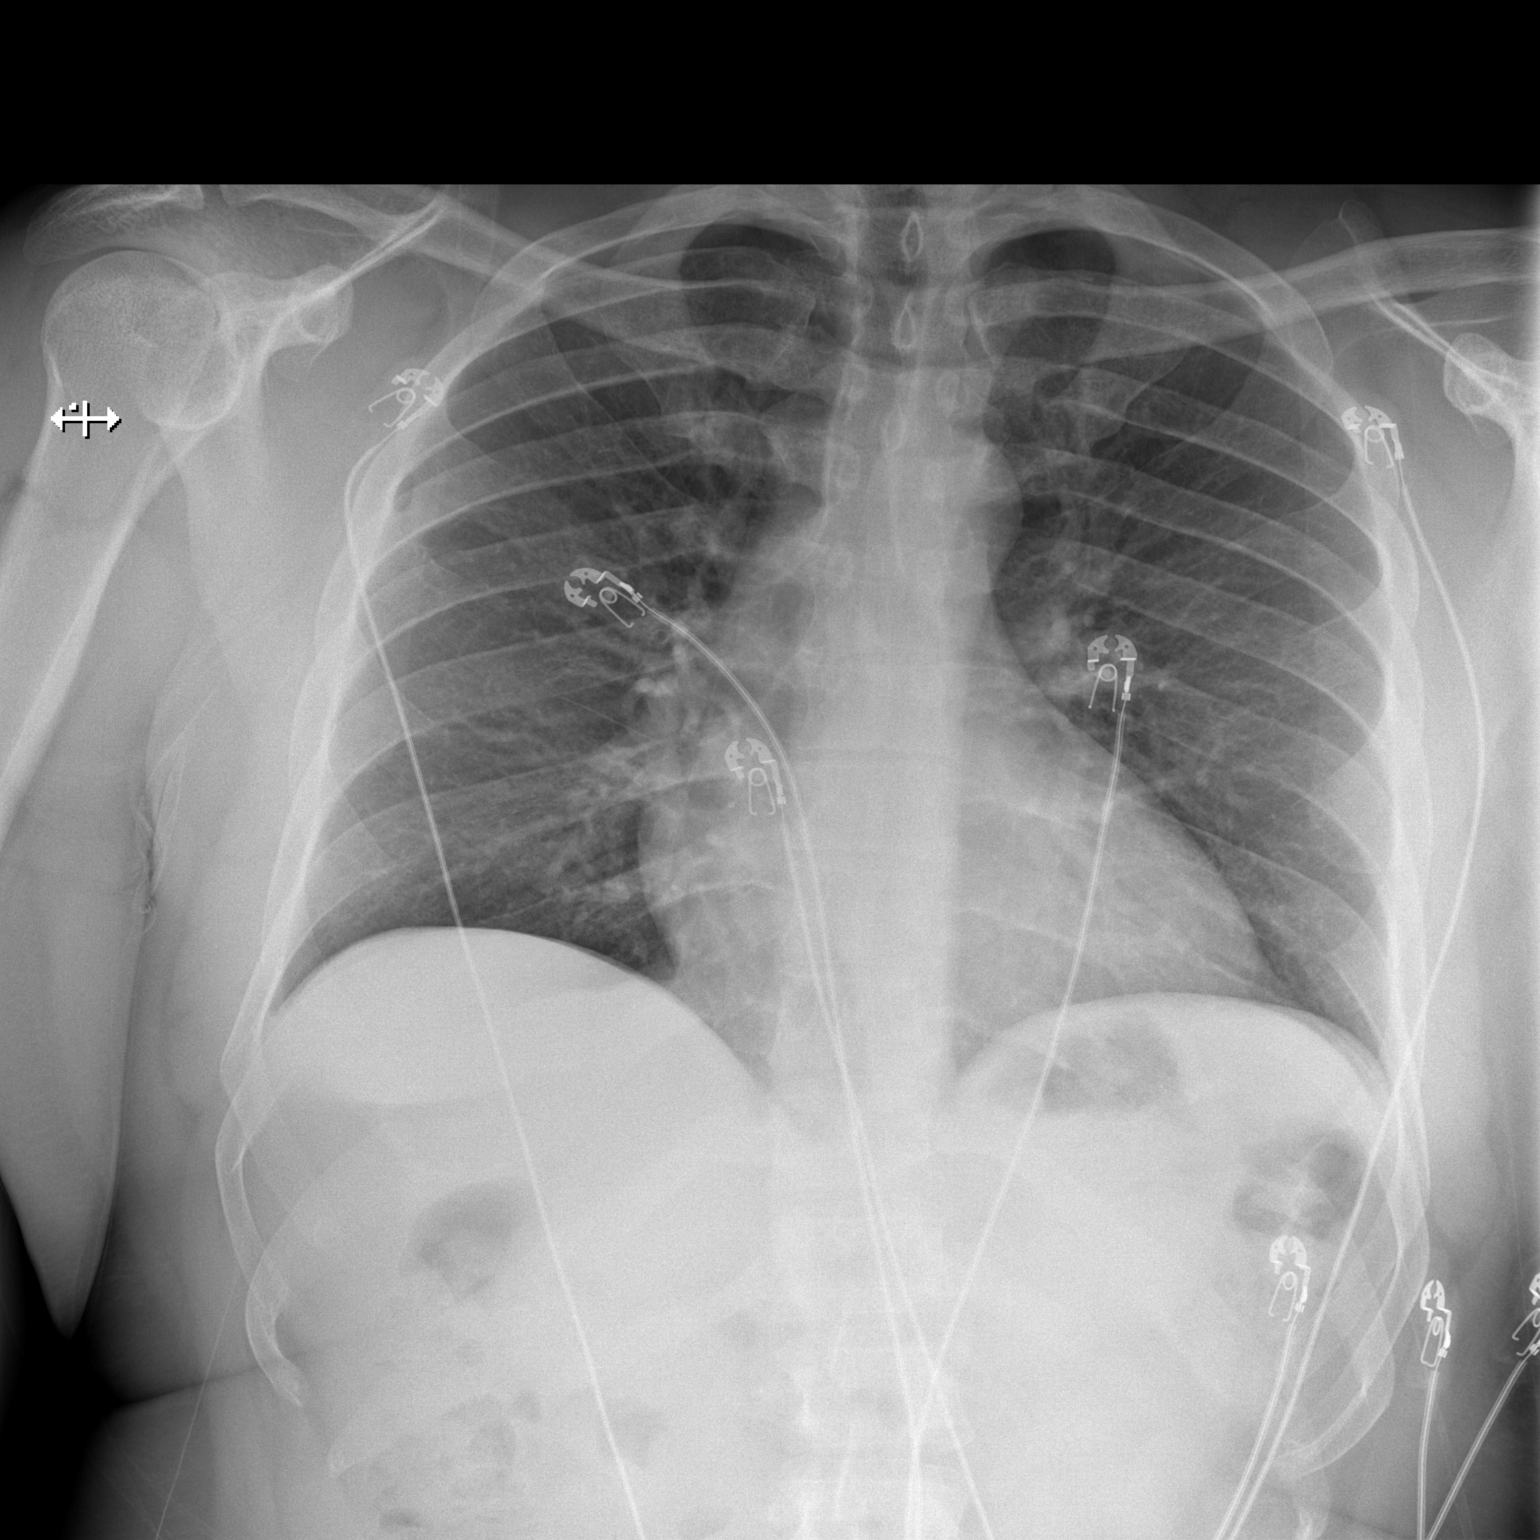

[w chest lat]
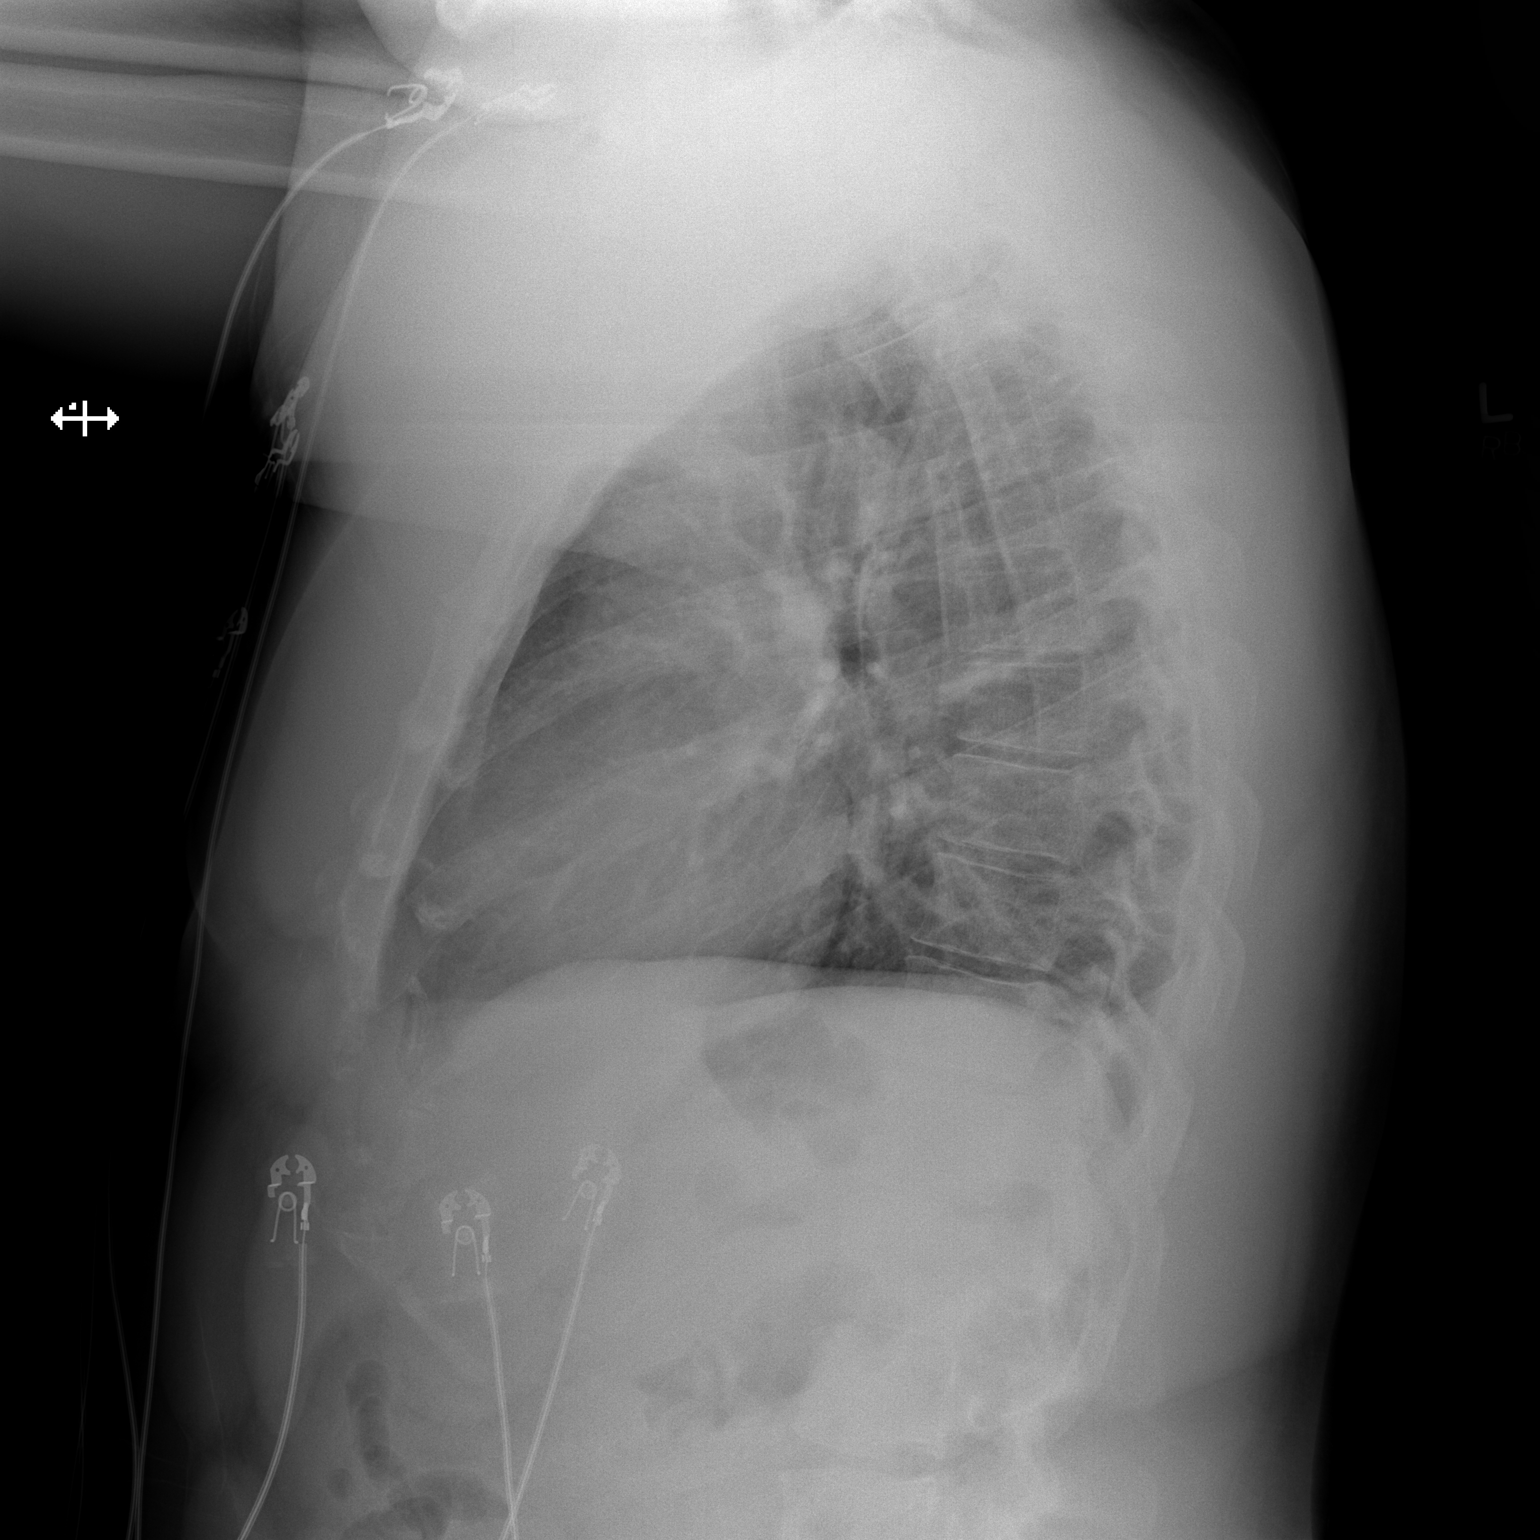

[2 of 2 positions shown; findings below may reference images not displayed]

FINDINGS: The heart size and mediastinal contours are within normal limits.
Both lungs are clear. The visualized skeletal structures are
unremarkable.
IMPRESSION: No active cardiopulmonary disease.

## 2021-12-10 ENCOUNTER — Ambulatory Visit (INDEPENDENT_AMBULATORY_CARE_PROVIDER_SITE_OTHER): Payer: Managed Care, Other (non HMO) | Admitting: Internal Medicine

## 2021-12-10 ENCOUNTER — Encounter: Payer: Self-pay | Admitting: Internal Medicine

## 2021-12-10 VITALS — BP 136/92 | HR 84 | Ht 71.0 in | Wt 270.0 lb

## 2021-12-10 DIAGNOSIS — R079 Chest pain, unspecified: Secondary | ICD-10-CM | POA: Diagnosis not present

## 2021-12-10 NOTE — Progress Notes (Signed)
?Cardiology Office Note:   ? ?Date:  12/10/2021  ? ?ID:  Jake Coriargentino J Coletti, DOB April 02, 1986, MRN [id number] ? ?PCP:  Roger KillWilliams, Breejante J, PA-C ?  ?[facility] Providers ?Cardiologist:  Maisie FusBranch, Roxas Clymer E, MD    ? ?Referring MD: Roger KillWilliams, Breejante J, *  ? ?No chief complaint on file. ?Establish Care ? ?History of Present Illness:   ? ?Jake Hoffman is a 36 y.o. male with a hx of HTN, appendectomy, referral to re-establish care ? ?Saw Dr. Elease HashimotoNahser in 2014. At that time he was on lisinopril-HCTz 10-12.5 mg daily. Blood pressure was normal. BPS typically ok. He feels a pressure in his chest with minimal activity. He noted this with a laundry basket. This has been going for 2 weeks. Can lift heavy things and has no symptoms. No DM2.  Parents have HTN. No premature CAD. He is having a lot of stress.  ? ?Past Medical History:  ?Diagnosis Date  ? Hernia of abdominal cavity   ? Hypertension   ? LVH (left ventricular hypertrophy)   ? ? ?Past Surgical History:  ?Procedure Laterality Date  ? LAPAROSCOPIC APPENDECTOMY N/A 04/13/2015  ? Procedure: APPENDECTOMY LAPAROSCOPIC;  Surgeon: Darnell Levelodd Gerkin, MD;  Location: [facility];  Service: General;  Laterality: N/A;  ? ? ?Current Medications: ?Current Meds  ?Medication Sig  ? acetaminophen (TYLENOL) 500 MG tablet Take 1,000 mg by mouth every 6 (six) hours as needed.  ? amLODipine (NORVASC) 5 MG tablet Take 5 mg by mouth daily.  ?  ? ?Allergies:   Patient has no known allergies.  ? ?Social History  ? ?Socioeconomic History  ? Marital status: Married  ?  Spouse name: Not on file  ? Number of children: Not on file  ? Years of education: Not on file  ? Highest education level: Not on file  ?Occupational History  ? Not on file  ?Tobacco Use  ? Smoking status: Never  ? Smokeless tobacco: Not on file  ?Substance and Sexual Activity  ? Alcohol use: Yes  ?  Alcohol/week: 0.0 standard drinks  ?  Comment: socially  ? Drug use: No  ? Sexual activity: Not on file  ?Other Topics Concern  ? Not on file   ?Social History Narrative  ? Married   ? One children and they are pregnant with their second  ? 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 ?Social Determinants of Health  ? ?Financial Resource Strain: Not on file  ?Food Insecurity: Not on file  ?Transportation Needs: Not on file  ?Physical Activity: Not on file  ?Stress: Not on file  ?Social Connections: Not on file  ?  ? ?Family History: ?The patient's family history includes Arthritis in his mother; Cancer in his father; Hyperlipidemia in his mother; Hypertension in his father and mother. ? ?ROS:   ?Please see the history of present illness.    ? All other systems reviewed and are negative. ? ?EKGs/Labs/Other Studies Reviewed:   ? ?The following studies were reviewed today: ? ? ?EKG:  EKG is  ordered today.  The ekg ordered today demonstrates  ? ?5/2-NSR, TWI diffusely, more prominent anterolaterally. Unchanged from prior (benign) ? ?Recent Labs: ?No results found for requested labs within last 8760 hours.  ?Recent Lipid Panel ?   ?Component Value Date/Time  ? CHOL 174 05/05/2013 0847  ? TRIG 32.0 05/05/2013 0847  ? HDL 47.60 05/05/2013 0847  ? CHOLHDL 4 05/05/2013 0847  ? VLDL 6.4 05/05/2013 0847  ? LDLCALC 120 (H) 05/05/2013  7169  ? ? ? ?Risk Assessment/Calculations:   ?  ? ?    ? ?Physical Exam:   ? ?VS:  ? ?Vitals:  ? 12/10/21 0804  ?BP: (!) 136/92  ?Pulse: 84  ?SpO2: 98%  ? ? ? ?Wt Readings from Last 3 Encounters:  ?12/10/21 270 lb (122.5 kg)  ?01/03/17 250 lb 9.6 oz (113.7 kg)  ?01/28/16 246 lb 6.4 oz (111.8 kg)  ?  ? ?GEN:  Well nourished, well developed in no acute distress ?HEENT: Normal ?NECK: No JVD; No carotid bruits ?LYMPHATICS: No lymphadenopathy ?CARDIAC: RRR, no murmurs, rubs, gallops ?RESPIRATORY:  Clear to auscultation without rales, wheezing or rhonchi  ?ABDOMEN: Soft, non-tender, non-distended ?MUSCULOSKELETAL:  No edema; No deformity  ?SKIN: Warm and dry ?NEUROLOGIC:  Alert and oriented x 3 ?PSYCHIATRIC:  Normal affect  ? ?ASSESSMENT:   ? ?Chest pain:  notes CCS II possible anginal symptoms. Associated with activity as well as emotional stress. He notes having increased sensitivity, if his study is normal may be an anxiety component to his symptoms ? ?HTN: continue norvasc 5 mg daily ? ? ?PLAN:   ? ?In order of problems listed above: ? ?Exercise Stress Echo ?Follow up in 6 months ? ?   ? ?Shared Decision Making/Informed Consent ?The risks [chest pain, shortness of breath, cardiac arrhythmias, dizziness, blood pressure fluctuations, myocardial infarction, stroke/transient ischemic attack, and life-threatening complications (estimated to be 1 in 10,000)], benefits (risk stratification, diagnosing coronary artery disease, treatment guidance) and alternatives of a stress or dobutamine stress echocardiogram were discussed in detail with Mr. Saxer and he agrees to proceed.  ? ? ?Medication Adjustments/Labs and Tests Ordered: ?Current medicines are reviewed at length with the patient today.  Concerns regarding medicines are outlined above.  ?Orders Placed This Encounter  ?Procedures  ? Cardiac Stress Test: Informed Consent Details: Physician/Practitioner Attestation; Transcribe to consent form and obtain patient signature  ? EKG 12-Lead  ? ECHOCARDIOGRAM STRESS TEST  ? ?No orders of the defined types were placed in this encounter. ? ? ?Patient Instructions  ?Medication Instructions:  ?No Changes In Medications at this time.  ?*If you need a refill on your cardiac medications before your next appointment, please call your pharmacy* ? ?Lab Work: ?None Ordered At This Time.  ?If you have labs (blood work) drawn today and your tests are completely normal, you will receive your results only by: ?MyChart Message (if you have MyChart) OR ?A paper copy in the mail ?If you have any lab test that is abnormal or we need to change your treatment, we will call you to review the results. ? ?Testing/Procedures: ?None Ordered At This Time.  ? ?Follow-Up: ?At [facility], you and  your health needs are our priority.  As part of our continuing mission to provide you with exceptional heart care, we have created designated Provider Care Teams.  These Care Teams include your primary Cardiologist (physician) and Advanced Practice Providers (APPs -  Physician Assistants and Nurse Practitioners) who all work together to provide you with the care you need, when you need it. ? ?We recommend signing up for the patient portal called "MyChart".  Sign up information is provided on this After Visit Summary.  MyChart is used to connect with patients for Virtual Visits (Telemedicine).  Patients are able to view lab/test results, encounter notes, upcoming appointments, etc.  Non-urgent messages can be sent to your provider as well.   ?To learn more about what you can do with MyChart, go to [email or web address].   ? ?  Your next appointment:   ?6 month(s) ? ?The format for your next appointment:   ?In Person ? ?Provider:   ?Maisie Fus, MD   ? ? ? ? ?   ? ?Signed, ?Maisie Fus, MD  ?12/10/2021 8:27 AM    ?[facility] ?

## 2021-12-10 NOTE — Patient Instructions (Addendum)
Medication Instructions:  ?No Changes In Medications at this time.  ?*If you need a refill on your cardiac medications before your next appointment, please call your pharmacy* ? ?Lab Work: ?None Ordered At This Time.  ?If you have labs (blood work) drawn today and your tests are completely normal, you will receive your results only by: ?MyChart Message (if you have MyChart) OR ?A paper copy in the mail ?If you have any lab test that is abnormal or we need to change your treatment, we will call you to review the results. ? ?Testing/Procedures: ?Your physician has requested that you have an STRESS echocardiogram. Echocardiography is a painless test that uses sound waves to create images of your heart. It provides your doctor with information about the size and shape of your heart and how well your heart?s chambers and valves are working. You may receive an ultrasound enhancing agent through an IV if needed to better visualize your heart during the echo.This procedure takes approximately one hour. There are no restrictions for this procedure. This will take place at the [address].  ? ? ?Follow-Up: ?At [facility], you and your health needs are our priority.  As part of our continuing mission to provide you with exceptional heart care, we have created designated Provider Care Teams.  These Care Teams include your primary Cardiologist (physician) and Advanced Practice Providers (APPs -  Physician Assistants and Nurse Practitioners) who all work together to provide you with the care you need, when you need it. ? ?We recommend signing up for the patient portal called "MyChart".  Sign up information is provided on this After Visit Summary.  MyChart is used to connect with patients for Virtual Visits (Telemedicine).  Patients are able to view lab/test results, encounter notes, upcoming appointments, etc.  Non-urgent messages can be sent to your provider as well.   ?To learn more about what you can do  with MyChart, go to [email or web address].   ? ?Your next appointment:   ?6 month(s) ? ?The format for your next appointment:   ?In Person ? ?Provider:   ?Maisie Fus, MD   ? ? ? ? ?  ?

## 2022-01-06 ENCOUNTER — Telehealth (HOSPITAL_COMMUNITY): Payer: Self-pay | Admitting: *Deleted

## 2022-01-06 ENCOUNTER — Other Ambulatory Visit (HOSPITAL_COMMUNITY): Payer: Managed Care, Other (non HMO)

## 2022-01-06 NOTE — Telephone Encounter (Signed)
Patient given detailed instructions per Myocardial Perfusion Study Information Sheet for the test on 01/12/2022 at 2:00. Patient notified to arrive 15 minutes early and that it is imperative to arrive on time for appointment to keep from having the test rescheduled.  If you need to cancel or reschedule your appointment, please call the office within 24 hours of your appointment. . Patient verbalized understanding..me

## 2022-01-08 ENCOUNTER — Other Ambulatory Visit (HOSPITAL_COMMUNITY): Payer: Managed Care, Other (non HMO)

## 2022-01-12 ENCOUNTER — Ambulatory Visit (HOSPITAL_COMMUNITY): Payer: Managed Care, Other (non HMO)

## 2022-01-12 ENCOUNTER — Encounter (HOSPITAL_COMMUNITY): Payer: Self-pay | Admitting: Internal Medicine

## 2022-01-12 ENCOUNTER — Ambulatory Visit (HOSPITAL_COMMUNITY): Payer: Managed Care, Other (non HMO) | Attending: Cardiovascular Disease

## 2022-02-11 ENCOUNTER — Other Ambulatory Visit (HOSPITAL_COMMUNITY): Payer: Managed Care, Other (non HMO)

## 2022-02-12 ENCOUNTER — Telehealth (HOSPITAL_COMMUNITY): Payer: Self-pay | Admitting: *Deleted

## 2022-02-12 NOTE — Telephone Encounter (Signed)
Left message with instructions for upcoming stress echo. Patient advised to arrive by 1:30.  Ricky Ala

## 2022-02-18 ENCOUNTER — Other Ambulatory Visit: Payer: Self-pay | Admitting: Internal Medicine

## 2022-02-18 ENCOUNTER — Ambulatory Visit (HOSPITAL_COMMUNITY): Payer: Managed Care, Other (non HMO) | Attending: Internal Medicine

## 2022-02-18 ENCOUNTER — Ambulatory Visit (HOSPITAL_BASED_OUTPATIENT_CLINIC_OR_DEPARTMENT_OTHER): Payer: Managed Care, Other (non HMO)

## 2022-02-18 DIAGNOSIS — I421 Obstructive hypertrophic cardiomyopathy: Secondary | ICD-10-CM

## 2022-02-18 DIAGNOSIS — R079 Chest pain, unspecified: Secondary | ICD-10-CM

## 2022-02-18 LAB — ECHOCARDIOGRAM COMPLETE
AR max vel: 3.95 cm2
AV Area VTI: 3.65 cm2
AV Area mean vel: 3.81 cm2
AV Mean grad: 5 mmHg
AV Peak grad: 8 mmHg
Ao pk vel: 1.42 m/s
Area-P 1/2: 2.76 cm2
S' Lateral: 2.2 cm

## 2022-02-18 MED ORDER — METOPROLOL SUCCINATE ER 50 MG PO TB24: 50.0000 mg | ORAL_TABLET | Freq: Every day | ORAL | 0 refills | Status: DC

## 2022-02-18 MED ORDER — PERFLUTREN LIPID MICROSPHERE
1.0000 mL | INTRAVENOUS | Status: AC | PRN
  Administered 2022-02-18: 2 mL via INTRAVENOUS

## 2022-06-22 NOTE — Progress Notes (Unsigned)
Cardiology Office Note:    Date:  06/23/2022   ID:  Phebe Colla, DOB March 08, 1986, MRN [id number]  PCP:  Heywood Bene, PA-C   [facility] Providers Cardiologist:  Janina Mayo, MD     Referring MD: Heywood Bene, *   No chief complaint on file. Establish Care  History of Present Illness:    Jake Hoffman is a 36 y.o. male with a hx of HTN, appendectomy, referral to re-establish care  Saw Dr. Acie Fredrickson in 2014. At that time he was on lisinopril-HCTz 10-12.5 mg daily. Blood pressure was normal. BPS typically ok. He feels a pressure in his chest with minimal activity. He noted this with a laundry basket. This has been going for 2 weeks. Can lift heavy things and has no symptoms. No DM2.  Parents have HTN. No premature CAD. He is having a lot of stress.   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 No family hx of SCD. No syncope  Cardiology Studies 06/23/2022: EF 60-70%, LOVT obstruction 13 mmHg at rest up to 53 mmHg with valsalva, IVS 13 mm.  Chordal SAM   Past Medical History:  Diagnosis Date   Hernia of abdominal cavity    Hypertension    LVH (left ventricular hypertrophy)     Past Surgical History:  Procedure Laterality Date   LAPAROSCOPIC APPENDECTOMY N/A 04/13/2015   Procedure: APPENDECTOMY LAPAROSCOPIC;  Surgeon: Armandina Gemma, MD;  Location: [facility];  Service: General;  Laterality: N/A;    Current Medications: Current Meds  Medication Sig   acetaminophen (TYLENOL) 500 MG tablet Take 1,000 mg by mouth every 6 (six) hours as needed.   metoprolol succinate (TOPROL-XL) 50 MG 24 hr tablet Take 1 tablet (50 mg total) by mouth daily. Take with or immediately following a meal.     Allergies:   Patient has no allergy information on record.   Social History   Socioeconomic History   Marital status: Married    Spouse name: Not on file   Number of children: Not on file   Years of education: Not on  file   Highest education level: Not on file  Occupational History   Not on file  Tobacco Use   Smoking status: Never   Smokeless tobacco: Not on file  Substance and Sexual Activity   Alcohol use: Yes    Alcohol/week: 0.0 standard drinks of alcohol    Comment: socially   Drug use: No   Sexual activity: Not on file  Other Topics Concern   Not on file  Social History Narrative   Married    One children and they are pregnant with their second   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  ROS:   Please see the history of present illness.     All other systems reviewed and are negative.  EKGs/Labs/Other Studies Reviewed:    The following studies were reviewed today:   EKG:  EKG is  ordered today.  The ekg ordered today demonstrates   5/2-NSR, TWI diffusely,  more prominent anterolaterally. Unchanged from prior (benign)  Recent Labs: No results found for requested labs within last 365 days.  Recent Lipid Panel    Component Value Date/Time   CHOL 174 05/05/2013 0847   TRIG 32.0 05/05/2013 0847   HDL 47.60 05/05/2013 0847   CHOLHDL 4 05/05/2013 0847   VLDL 6.4 05/05/2013 0847   LDLCALC 120 (H) 05/05/2013 0847     Risk Assessment/Calculations:           Physical Exam:    VS:   Vitals:   06/23/22 0830  BP: 112/80  Pulse: 84  SpO2: 99%      Wt Readings from Last 3 Encounters:  06/23/22 266 lb 3.7 oz (120.8 kg)  12/10/21 270 lb (122.5 kg)  01/03/17 250 lb 9.6 oz (113.7 kg)     GEN:  Well nourished, well developed in no acute distress HEENT: Normal NECK: No JVD; No  carotid bruits LYMPHATICS: No lymphadenopathy CARDIAC: RRR, no murmurs, rubs, gallops RESPIRATORY:  Clear to auscultation without rales, wheezing or rhonchi  ABDOMEN: Soft, non-tender, non-distended MUSCULOSKELETAL:  No edema; No deformity  SKIN: Warm and dry NEUROLOGIC:  Alert and oriented x 3 PSYCHIATRIC:  Normal affect   ASSESSMENT:    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 No high risk features for SCD. We discussed this. He is asymptomatic. Continue metop XL 50 mg daily. Will get CMR and ziopatch to continue to risk stratify   HTN: continue norvasc 5 mg daily   PLAN:    In order of problems listed above:  CMR 7 day ziopatch Follow up in 6 months     Orders Placed This Encounter  Procedures   MR CARDIAC MORPHOLOGY W WO CONTRAST   LONG TERM MONITOR (3-14 DAYS)   No orders of the defined types were placed in this encounter.   Patient Instructions  Medication Instructions:  No Changes *If you need a refill on your cardiac medications before your next appointment, please call your pharmacy*   Lab Work: No Labs If you have labs (blood work) drawn today and your tests are completely normal, you will receive your results only by: [place] (if you have MyChart) OR A paper copy in the mail If you have any lab test that is abnormal or we need to change your treatment, we will call you to review the results.   Testing/Procedures: [facility] , [address] Your physician has requested that you have a cardiac MRI. Cardiac MRI uses a computer to create images of your heart as its beating, producing both still and moving pictures of your heart and major blood vessels. For further information please visit [email or web address] Please follow the instruction sheet given to you today for more information.   ZIO AT Long term monitor-Live Telemetry  Your physician has requested you wear a ZIO patch monitor for 7 days.  This is a single patch monitor.  [employer] supplies one patch monitor per enrollment. Additional  stickers are not available.  Please do not apply patch if you will be having a Nuclear Stress Test, Echocardiogram, Cardiac CT, MRI,  or Chest Xray during the period you would be wearing the monitor. The patch cannot be worn during  these tests. You cannot remove and re-apply the ZIO AT patch monitor.  Your ZIO patch monitor will be mailed 3 day USPS to your address on file. It may take 3-5 days to  receive your monitor after you have been enrolled.  Once you  have received your monitor, please review the enclosed instructions. Your monitor has  already been registered assigning a specific monitor serial # to you.   Billing and Patient Assistance Program information  Theodore Demark has been supplied with any insurance information on record for billing. [employer] offers a sliding scale Patient Assistance Program for patients without insurance, or whose  insurance does not completely cover the cost of the ZIO patch monitor. You must apply for the  Patient Assistance Program to qualify for the discounted rate. To apply, call [employer] at [phone],  select option 4, select option 2 , ask to apply for the Patient Assistance Program, (you can request an  interpreter if needed). [employer] will ask your household income and how many people are in your  household. [employer] will quote your out-of-pocket cost based on this information. They will also be able  to set up a 12 month interest free payment plan if needed.  Applying the monitor   Shave hair from upper left chest.  Hold the abrader disc by orange tab. Rub the abrader in 40 strokes over left upper chest as indicated in  your monitor instructions.  Clean area with 4 enclosed alcohol pads. Use all pads to ensure the area is cleaned thoroughly. Let  dry.  Apply patch as indicated in monitor instructions. Patch will be placed under collarbone on left side of  chest with arrow pointing upward.   Rub patch adhesive wings for 2 minutes. Remove the white label marked "1". Remove the white label  marked "2". Rub patch adhesive wings for 2 additional minutes.  While looking in a mirror, press and release button in center of patch. A small green light will flash 3-4  times. This will be your only indicator that the monitor has been turned on.  Do not shower for the first 24 hours. You may shower after the first 24 hours.  Press the button if you feel a symptom. You will hear a small click. Record Date, Time and Symptom in  the Patient Log.   Starting the Gateway  In your kit there is a Hydrographic surveyor box the size of a cellphone. This is Airline pilot. It transmits all your  recorded data to [facility]. This box must always stay within 10 feet of you. Open the box and push the *  button. There will be a light that blinks orange and then green a few times. When the light stops  blinking, the Gateway is connected to the ZIO patch. Call [employer] at [phone] to confirm your monitor is transmitting.  Returning your monitor  Remove your patch and place it inside the [place]. In the lower half of the Gateway there is a white  bag with prepaid postage on it. Place Gateway in bag and seal. Mail package back to [place] as soon as  possible. Your physician should have your final report approximately 7 days after you have mailed back  your monitor. Call [place] at [phone] if you have questions regarding your ZIO AT  patch monitor. Call them immediately if you see an orange light blinking on your monitor.  If your monitor falls off in less than 4 days, contact our Monitor department at [phone]. If your  monitor becomes loose or falls off after 4 days call [employer] at [phone] for suggestions on  securing your monitor  Follow-Up: At [facility], you and your health needs are our priority.  As part of our continuing mission to provide  you with  exceptional heart care, we have created designated Provider Care Teams.  These Care Teams include your primary Cardiologist (physician) and Advanced Practice Providers (APPs -  Physician Assistants and Nurse Practitioners) who all work together to provide you with the care you need, when you need it.  We recommend signing up for the patient portal called "MyChart".  Sign up information is provided on this After Visit Summary.  MyChart is used to connect with patients for Virtual Visits (Telemedicine).  Patients are able to view lab/test results, encounter notes, upcoming appointments, etc.  Non-urgent messages can be sent to your provider as well.   To learn more about what you can do with MyChart, go to [email or web address].    Your next appointment:   6 month(s)  The format for your next appointment:   In Person  Provider:   Janina Mayo, MD    Signed, Janina Mayo, MD  06/23/2022 8:59 AM    [place]

## 2022-06-23 ENCOUNTER — Ambulatory Visit: Payer: Self-pay | Attending: Internal Medicine | Admitting: Internal Medicine

## 2022-06-23 ENCOUNTER — Ambulatory Visit: Payer: PRIVATE HEALTH INSURANCE | Attending: Internal Medicine

## 2022-06-23 ENCOUNTER — Other Ambulatory Visit: Payer: Self-pay | Admitting: Internal Medicine

## 2022-06-23 ENCOUNTER — Encounter: Payer: Self-pay | Admitting: Internal Medicine

## 2022-06-23 VITALS — BP 112/80 | HR 84 | Ht 71.0 in | Wt 266.2 lb

## 2022-06-23 DIAGNOSIS — R9431 Abnormal electrocardiogram [ECG] [EKG]: Secondary | ICD-10-CM

## 2022-06-23 DIAGNOSIS — I421 Obstructive hypertrophic cardiomyopathy: Secondary | ICD-10-CM

## 2022-06-23 DIAGNOSIS — I493 Ventricular premature depolarization: Secondary | ICD-10-CM

## 2022-06-23 NOTE — Progress Notes (Unsigned)
Enrolled for [employer] to mail a ZIO AT Live Telemetry monitor to patients address on file.  [id number] mailed 06/23/22 and applied in office 07/08/22.

## 2022-06-23 NOTE — Patient Instructions (Signed)
Medication Instructions:  No Changes *If you need a refill on your cardiac medications before your next appointment, please call your pharmacy*   Lab Work: No Labs If you have labs (blood work) drawn today and your tests are completely normal, you will receive your results only by: [place] (if you have MyChart) OR A paper copy in the mail If you have any lab test that is abnormal or we need to change your treatment, we will call you to review the results.   Testing/Procedures: [facility] , [address]. Your physician has requested that you have a cardiac MRI. Cardiac MRI uses a computer to create images of your heart as its beating, producing both still and moving pictures of your heart and major blood vessels. For further information please visit [email or web address] Please follow the instruction sheet given to you today for more information.   ZIO AT Long term monitor-Live Telemetry  Your physician has requested you wear a ZIO patch monitor for 7 days.  This is a single patch monitor. [employer] supplies one patch monitor per enrollment. Additional  stickers are not available.  Please do not apply patch if you will be having a Nuclear Stress Test, Echocardiogram, Cardiac CT, MRI,  or Chest Xray during the period you would be wearing the monitor. The patch cannot be worn during  these tests. You cannot remove and re-apply the ZIO AT patch monitor.  Your ZIO patch monitor will be mailed 3 day USPS to your address on file. It may take 3-5 days to  receive your monitor after you have been enrolled.  Once you have received your monitor, please review the enclosed instructions. Your monitor has  already been registered assigning a specific monitor serial # to you.   Billing and Patient Assistance Program information  Jake Hoffman has been supplied with any insurance information on record for billing. [employer] offers a sliding scale Patient Assistance Program for  patients without insurance, or whose  insurance does not completely cover the cost of the ZIO patch monitor. You must apply for the  Patient Assistance Program to qualify for the discounted rate. To apply, call [employer] at [phone],  select option 4, select option 2 , ask to apply for the Patient Assistance Program, (you can request an  interpreter if needed). [employer] will ask your household income and how many people are in your  household. [employer] will quote your out-of-pocket cost based on this information. They will also be able  to set up a 12 month interest free payment plan if needed.  Applying the monitor   Shave hair from upper left chest.  Hold the abrader disc by orange tab. Rub the abrader in 40 strokes over left upper chest as indicated in  your monitor instructions.  Clean area with 4 enclosed alcohol pads. Use all pads to ensure the area is cleaned thoroughly. Let  dry.  Apply patch as indicated in monitor instructions. Patch will be placed under collarbone on left side of  chest with arrow pointing upward.  Rub patch adhesive wings for 2 minutes. Remove the white label marked "1". Remove the white label  marked "2". Rub patch adhesive wings for 2 additional minutes.  While looking in a mirror, press and release button in center of patch. A small green light will flash 3-4  times. This will be your only indicator that the monitor has been turned on.  Do not shower for the first 24 hours. You may shower after  the first 24 hours.  Press the button if you feel a symptom. You will hear a small click. Record Date, Time and Symptom in  the Patient Log.   Starting the Gateway  In your kit there is a Hydrographic surveyor box the size of a cellphone. This is Airline pilot. It transmits all your  recorded data to [facility]. This box must always stay within 10 feet of you. Open the box and push the *  button. There will be a light that blinks orange and then green a few times. When the  light stops  blinking, the Gateway is connected to the ZIO patch. Call [employer] at [phone] to confirm your monitor is transmitting.  Returning your monitor  Remove your patch and place it inside the [place]. In the lower half of the Gateway there is a white  bag with prepaid postage on it. Place Gateway in bag and seal. Mail package back to [place] as soon as  possible. Your physician should have your final report approximately 7 days after you have mailed back  your monitor. Call [place] at [phone] if you have questions regarding your ZIO AT  patch monitor. Call them immediately if you see an orange light blinking on your monitor.  If your monitor falls off in less than 4 days, contact our Monitor department at [phone]. If your  monitor becomes loose or falls off after 4 days call [employer] at [phone] for suggestions on  securing your monitor  Follow-Up: At [facility], you and your health needs are our priority.  As part of our continuing mission to provide you with exceptional heart care, we have created designated Provider Care Teams.  These Care Teams include your primary Cardiologist (physician) and Advanced Practice Providers (APPs -  Physician Assistants and Nurse Practitioners) who all work together to provide you with the care you need, when you need it.  We recommend signing up for the patient portal called "MyChart".  Sign up information is provided on this After Visit Summary.  MyChart is used to connect with patients for Virtual Visits (Telemedicine).  Patients are able to view lab/test results, encounter notes, upcoming appointments, etc.  Non-urgent messages can be sent to your provider as well.   To learn more about what you can do with MyChart, go to [email or web address].    Your next appointment:   6 month(s)  The format for your next appointment:   In Person  Provider:   Janina Mayo, MD

## 2022-07-08 ENCOUNTER — Telehealth: Payer: Self-pay | Admitting: Internal Medicine

## 2022-07-08 ENCOUNTER — Ambulatory Visit (INDEPENDENT_AMBULATORY_CARE_PROVIDER_SITE_OTHER): Payer: 59

## 2022-07-08 ENCOUNTER — Ambulatory Visit: Payer: PRIVATE HEALTH INSURANCE | Attending: Cardiology

## 2022-07-08 ENCOUNTER — Encounter (HOSPITAL_COMMUNITY): Payer: Self-pay

## 2022-07-08 ENCOUNTER — Ambulatory Visit (HOSPITAL_COMMUNITY)
Admission: RE | Admit: 2022-07-08 | Discharge: 2022-07-08 | Disposition: A | Discharge status: Home / Self Care | Payer: 59 | Source: Ambulatory Visit | Attending: Family Medicine | Admitting: Family Medicine

## 2022-07-08 VITALS — BP 175/109 | HR 86 | Resp 17

## 2022-07-08 DIAGNOSIS — I10 Essential (primary) hypertension: Secondary | ICD-10-CM

## 2022-07-08 DIAGNOSIS — M25571 Pain in right ankle and joints of right foot: Secondary | ICD-10-CM | POA: Diagnosis not present

## 2022-07-08 DIAGNOSIS — M25511 Pain in right shoulder: Secondary | ICD-10-CM

## 2022-07-08 MED ORDER — CYCLOBENZAPRINE HCL 10 MG PO TABS: ORAL_TABLET | ORAL | 0 refills | Status: DC

## 2022-07-08 MED ORDER — DICLOFENAC SODIUM 75 MG PO TBEC: 75.0000 mg | DELAYED_RELEASE_TABLET | Freq: Two times a day (BID) | ORAL | 0 refills | Status: DC
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
## 2022-07-08 NOTE — Telephone Encounter (Signed)
Pt calling in regards to getting help to apply Heart Monitor. Please advise

## 2022-07-08 NOTE — Discharge Instructions (Addendum)
Your blood pressure was noted to be elevated during your visit today. If you are currently taking medication for high blood pressure, please ensure you are taking this as directed. If you do not have a history of high blood pressure and your blood pressure remains persistently elevated, you may need to begin taking a medication at some point. You may return here within the next few days to recheck if unable to see your primary care provider or if you do not have a one.  BP (!) 175/109 (BP Location: Left Arm)   Pulse 86   Resp 17   SpO2 97%   BP Readings from Last 3 Encounters:  07/08/22 (!) 175/109  06/23/22 112/80  12/10/21 (!) 136/92
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
## 2022-07-23 NOTE — Telephone Encounter (Signed)
Called patient to inquire on the return to his ZIO AT patch and Gateway to [place].  Monitoring stopped on 07/16/22 and according to [facility] website monitor status is still pending return.  Explained to patient how to return monitor and that [facility] will not provide [place] with a Final report until they receive his patch and Gateway.  Patient will put in mail tomorrow.

## 2022-10-01 ENCOUNTER — Other Ambulatory Visit (HOSPITAL_COMMUNITY): Payer: PRIVATE HEALTH INSURANCE

## 2022-10-05 ENCOUNTER — Ambulatory Visit (HOSPITAL_COMMUNITY): Admission: RE | Admit: 2022-10-05 | Payer: PRIVATE HEALTH INSURANCE | Source: Ambulatory Visit

## 2022-10-19 ENCOUNTER — Telehealth (HOSPITAL_COMMUNITY): Payer: Self-pay | Admitting: *Deleted

## 2022-10-19 NOTE — Telephone Encounter (Signed)
Attempted to call patient regarding upcoming cardiac MRI appointment. Left message on voicemail with name and callback number  Therman Hughlett RN Navigator Cardiac Imaging [facility] [phone] Office [phone] Cell  

## 2022-10-20 ENCOUNTER — Ambulatory Visit (HOSPITAL_COMMUNITY): Admission: RE | Admit: 2022-10-20 | Payer: PRIVATE HEALTH INSURANCE | Source: Ambulatory Visit

## 2022-10-21 ENCOUNTER — Other Ambulatory Visit (HOSPITAL_COMMUNITY): Payer: PRIVATE HEALTH INSURANCE

## 2022-12-22 ENCOUNTER — Ambulatory Visit: Payer: Self-pay | Attending: Internal Medicine | Admitting: Internal Medicine

## 2023-04-13 ENCOUNTER — Ambulatory Visit: Payer: Self-pay | Admitting: Internal Medicine

## 2023-05-05 ENCOUNTER — Telehealth: Payer: Self-pay | Admitting: Internal Medicine

## 2023-05-05 NOTE — Telephone Encounter (Signed)
   Pt c/o of Chest Pain: STAT if active CP, including tightness, pressure, jaw pain, radiating pain to shoulder/upper arm/back, CP unrelieved by Nitro. Symptoms reported of SOB, nausea, vomiting, sweating.  1. Are you having CP right now? No, not at thiss time- Sunday and Monday it was really bad    2. Are you experiencing any other symptoms (ex. SOB, nausea, vomiting, sweating)?  Had shortness at that time- not at this time   3. Is your CP continuous or coming and going? Comes and goes   4. Have you taken Nitroglycerin? no   5. How long have you been experiencing CP? Last 2 weeks- were worse on Monday (05-03-23) - patient wanted to be seen- I made him an appointment for Tuesday with Samara Deist- patient was offered an appointment for Monday, could not come   6. If NO CP at time of call then end call with telling Pt to call back or call 911 if Chest pain returns prior to return call from triage team.

## 2023-05-05 NOTE — Telephone Encounter (Signed)
Left voicemail to return call to office.

## 2023-05-05 NOTE — Telephone Encounter (Signed)
Pt returning nurses phone call. Please advise ?

## 2023-05-05 NOTE — Telephone Encounter (Addendum)
Spoke with patient and he states for over the past week he has been having chest pain off and on along with SOB. He states he also has not been sleeping good. PCP gave him trazodone. He states that his job is not strenuous but he gets SOB.  Currently asymptomatic. Discussed ED precautions. Appointment made for Monday with APP

## 2023-05-07 NOTE — Progress Notes (Unsigned)
Cardiology Clinic Note   Patient Name: Jake Hoffman Date of Encounter: 05/07/2023  Primary Care Provider:  Roger Kill, PA-C Primary Cardiologist:  Maisie Fus, MD  Patient Profile    37 year old male with history of hypertension, HOCM, echocardiogram 06/23/2022 EF 6070% LVOT obstruction 13 mmHg at rest and up to 53 mmHg with Valsalva, chordal SAM, IVS 13 mm when last seen by Dr. Wyline Mood on 06/23/2022 the patient was found to be asymptomatic.  The patient was to have a CMR and Zio patch, blood pressure was well-controlled on amlodipine 5 mg daily.  Cardiac MRI was not completed, there are no results for ZIO monitor.  Past Medical History    Past Medical History:  Diagnosis Date   Hernia of abdominal cavity    Hypertension    LVH (left ventricular hypertrophy)    Past Surgical History:  Procedure Laterality Date   LAPAROSCOPIC APPENDECTOMY N/A 04/13/2015   Procedure: APPENDECTOMY LAPAROSCOPIC;  Surgeon: Darnell Level, MD;  Location: [facility];  Service: General;  Laterality: N/A;    Allergies  No Known Allergies  History of Present Illness    ***  Home Medications    Current Outpatient Medications  Medication Sig Dispense Refill   acetaminophen (TYLENOL) 500 MG tablet Take 1,000 mg by mouth every 6 (six) hours as needed.     cyclobenzaprine (FLEXERIL) 10 MG tablet Take 1 tablet by mouth 3 times daily as needed for muscle spasm. Warning: May cause drowsiness. 21 tablet 0   diclofenac (VOLTAREN) 75 MG EC tablet Take 1 tablet (75 mg total) by mouth 2 (two) times daily. 14 tablet 0   metoprolol succinate (TOPROL-XL) 50 MG 24 hr tablet Take 1 tablet (50 mg total) by mouth daily. Take with or immediately following a meal. 90 tablet 0   No current facility-administered medications for this visit.     Family History    Family History  Problem Relation Age of Onset   Hypertension Father    Cancer Father        unknown ? lung   Hyperlipidemia Mother     Hypertension Mother    Arthritis Mother    He indicated that the status of his mother is unknown. He indicated that his father is deceased.  Social History    Social History   Socioeconomic History   Marital status: Married    Spouse name: Not on file   Number of children: Not on file   Years of education: Not on file   Highest education level: Not on file  Occupational History   Not on file  Tobacco Use   Smoking status: Never   Smokeless tobacco: Not on file  Substance and Sexual Activity   Alcohol use: Yes    Alcohol/week: 0.0 standard drinks of alcohol    Comment: socially   Drug use: No   Sexual activity: Not on file  Other Topics Concern   Not on file  Social History Narrative   Married    One children and they are pregnant with their second   Production designer, theatre/television/film of moving company    Social Determinants of Health   Financial Resource Strain: Not on file  Food Insecurity: Not on file  Transportation Needs: Not on file  Physical Activity: Not on file  Stress: Not on file  Social Connections: Not on file  Intimate Partner Violence: Not on file     Review of Systems    General:  No chills, fever, night sweats or  weight changes.  Cardiovascular:  No chest pain, dyspnea on exertion, edema, orthopnea, palpitations, paroxysmal nocturnal dyspnea. Dermatological: No rash, lesions/masses Respiratory: No cough, dyspnea Urologic: No hematuria, dysuria Abdominal:   No nausea, vomiting, diarrhea, bright red blood per rectum, melena, or hematemesis Neurologic:  No visual changes, wkns, changes in mental status. All other systems reviewed and are otherwise negative except as noted above.       Physical Exam    VS:  There were no vitals taken for this visit. , BMI There is no height or weight on file to calculate BMI.     GEN: Well nourished, well developed, in no acute distress. HEENT: normal. Neck: Supple, no JVD, carotid bruits, or masses. Cardiac: RRR, no murmurs, rubs, or  gallops. No clubbing, cyanosis, edema.  Radials/DP/PT 2+ and equal bilaterally.  Respiratory:  Respirations regular and unlabored, clear to auscultation bilaterally. GI: Soft, nontender, nondistended, BS + x 4. MS: no deformity or atrophy. Skin: warm and dry, no rash. Neuro:  Strength and sensation are intact. Psych: Normal affect.      Lab Results  Component Value Date   WBC 9.8 01/03/2017   HGB 16.7 01/03/2017   HCT 47.6 01/03/2017   MCV 96.0 01/03/2017   PLT 312 01/03/2017   Lab Results  Component Value Date   CREATININE 1.42 (H) 01/03/2017   BUN 19 01/03/2017   NA 136 01/03/2017   K 3.6 01/03/2017   CL 101 01/03/2017   CO2 29 01/03/2017   Lab Results  Component Value Date   ALT 39 01/03/2017   AST 39 01/03/2017   ALKPHOS 90 01/03/2017   BILITOT 0.9 01/03/2017   Lab Results  Component Value Date   CHOL 174 05/05/2013   HDL 47.60 05/05/2013   LDLCALC 120 (H) 05/05/2013   TRIG 32.0 05/05/2013   CHOLHDL 4 05/05/2013    No results found for: "HGBA1C"   Review of Prior Studies      Assessment & Plan   1.  ***     {Are you ordering a CV Procedure (e.g. stress test, cath, DCCV, TEE, etc)?   Press F2        :[id number]}   Signed, Bettey Mare. Liborio Nixon, ANP, AACC   05/07/2023 12:57 PM      Office [phone] Fax [phone]  Notice: This dictation was prepared with Dragon dictation along with smaller phrase technology. Any transcriptional errors that result from this process are unintentional and may not be corrected upon review.

## 2023-05-10 ENCOUNTER — Encounter: Payer: Self-pay | Admitting: Adult Health

## 2023-05-10 ENCOUNTER — Ambulatory Visit: Payer: 59 | Attending: Internal Medicine | Admitting: Adult Health

## 2023-05-10 VITALS — BP 162/100 | HR 97 | Ht 71.0 in | Wt 265.8 lb

## 2023-05-10 DIAGNOSIS — R1010 Upper abdominal pain, unspecified: Secondary | ICD-10-CM | POA: Diagnosis not present

## 2023-05-10 DIAGNOSIS — I421 Obstructive hypertrophic cardiomyopathy: Secondary | ICD-10-CM

## 2023-05-10 DIAGNOSIS — I10 Essential (primary) hypertension: Secondary | ICD-10-CM

## 2023-05-10 NOTE — Patient Instructions (Signed)
Medication Instructions:  Take Medication Daily *If you need a refill on your cardiac medications before your next appointment, please call your pharmacy*   Lab Work: BMET Today If you have labs (blood work) drawn today and your tests are completely normal, you will receive your results only by: MyChart Message (if you have MyChart) OR A paper copy in the mail If you have any lab test that is abnormal or we need to change your treatment, we will call you to review the results.   Testing/Procedures: [facility] Your physician has requested that you have a cardiac MRI. Cardiac MRI uses a computer to create images of your heart as its beating, producing both still and moving pictures of your heart and major blood vessels. For further information please visit [email or web address]. Please follow the instruction sheet given to you today for more information.    Non-Cardiac CT scanning, (CAT scanning), is a noninvasive, special x-ray that produces cross-sectional images of the body using x-rays and a computer. CT scans help physicians diagnose and treat medical conditions. For some CT exams, a contrast material is used to enhance visibility in the area of the body being studied. CT scans provide greater clarity and reveal more details than regular x-ray exams.   Follow-Up: At [facility], you and your health needs are our priority.  As part of our continuing mission to provide you with exceptional heart care, we have created designated Provider Care Teams.  These Care Teams include your primary Cardiologist (physician) and Advanced Practice Providers (APPs -  Physician Assistants and Nurse Practitioners) who all work together to provide you with the care you need, when you need it.  We recommend signing up for the patient portal called "MyChart".  Sign up information is provided on this After Visit Summary.  MyChart is used to connect with patients for Virtual Visits (Telemedicine).   Patients are able to view lab/test results, encounter notes, upcoming appointments, etc.  Non-urgent messages can be sent to your provider as well.   To learn more about what you can do with MyChart, go to [email or web address].    Your next appointment:   4-6 week(s)  Provider:   [name], DNP, ANP

## 2023-05-10 NOTE — Progress Notes (Signed)
Cardiology Clinic Note   Patient Name: Jake Hoffman Date of Encounter: 05/10/2023  Primary Care Provider:  Roger Kill, PA-C Primary Cardiologist:  Jake Fus, MD  Patient Profile    37 year old male with history of hypertension, HOCM, echocardiogram 06/23/2022 EF 60-70% LVOT obstruction 13 mmHg at rest and up to 53 mmHg with Valsalva, chordal SAM, IVS 13 mm when last seen by Dr. Wyline Hoffman on 06/23/2022 the patient was found to be asymptomatic.  The patient was to have a CMR and Zio patch, blood pressure was well-controlled on amlodipine 5 mg daily.  Cardiac MRI was not completed, there are no results for ZIO monitor.   Past Medical History    Past Medical History:  Diagnosis Date   Hernia of abdominal cavity    Hypertension    LVH (left ventricular hypertrophy)    Past Surgical History:  Procedure Laterality Date   LAPAROSCOPIC APPENDECTOMY N/A 04/13/2015   Procedure: APPENDECTOMY LAPAROSCOPIC;  Surgeon: Jake Level, MD;  Location: [facility];  Service: General;  Laterality: N/A;    Allergies  No Known Allergies  History of Present Illness    [name] returns today for ongoing assessment and management of hypertension and HOCM.  He has not been compliant with medications.  He keeps him in his car and forgets to take them.  He was having them at his home but he would forget to take them and so thought about putting them in his car to take him on days he went to work.  Unfortunately his days of work are inconsistent and he has been forgetting to take them at all.  He has frequent episodes of chest discomfort.  He also is having some upper abdominal pain.  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  He did not complete testing as he did not have insurance.  Home Medications    Current Outpatient Medications  Medication Sig Dispense Refill   losartan (COZAAR) 50 MG tablet Take 50 mg by mouth daily.     metoprolol succinate (TOPROL-XL) 50 MG 24 hr  tablet Take 1 tablet (50 mg total) by mouth daily. Take with or immediately following a meal. 90 tablet 0   traZODone (DESYREL) 50 MG tablet Take 100 mg by mouth at bedtime as needed.     acetaminophen (TYLENOL) 500 MG tablet Take 1,000 mg by mouth every 6 (six) hours as needed. (Patient not taking: Reported on 05/10/2023)     cyclobenzaprine (FLEXERIL) 10 MG tablet Take 1 tablet by mouth 3 times daily as needed for muscle spasm. Warning: May cause drowsiness. (Patient not taking: Reported on 05/10/2023) 21 tablet 0   diclofenac (VOLTAREN) 75 MG EC tablet Take 1 tablet (75 mg total) by mouth 2 (two) times daily. (Patient not taking: Reported on 05/10/2023) 14 tablet 0   No current facility-administered medications for this visit.     Family History    Family History  Problem Relation Age of Onset   Hypertension Father    Cancer Father        unknown ? lung   Hyperlipidemia Mother    Hypertension Mother    Arthritis Mother    He indicated that the status of his mother is unknown. He indicated that his father is deceased.  Social History    Social History   Socioeconomic History   Marital status: Married    Spouse name: Not on file   Number of children: Not on file  Years of education: Not on file   Highest education Hoffman: Not on file  Occupational History   Not on file  Tobacco Use   Smoking status: Never   Smokeless tobacco: Not on file  Substance and Sexual Activity   Alcohol use: Yes    Alcohol/week: 0.0 standard drinks of alcohol    Comment: socially   Drug use: No   Sexual activity: Not on file  Other Topics Concern   Not on file  Social History Narrative   Married    One children and they are pregnant with their second   Production designer, theatre/television/film of moving company    Social Determinants of Health   Financial Resource Strain: Not on file  Food Insecurity: Not on file  Transportation Needs: Not on file  Physical Activity: Not on file  Stress: Not on file  Social Connections:  Not on file  Intimate Partner Violence: Not on file     Review of Systems    General:  No chills, fever, night sweats or weight changes.  Cardiovascular: Positive for chest pain, dyspnea on exertion, edema, orthopnea, palpitations, paroxysmal nocturnal dyspnea. Dermatological: No rash, lesions/masses Respiratory: No cough, dyspnea Urologic: No hematuria, dysuria Abdominal:   Positive for abdominal pain , no nausea, vomiting, diarrhea, bright red blood per rectum, melena, or hematemesis Neurologic:  No visual changes, wkns, changes in mental status. All other systems reviewed and are otherwise negative except as noted above.  EKG Interpretation Date/Time:  Monday May 10 2023 14:45:41 EDT Ventricular Rate:  97 PR Interval:  158 QRS Duration:  86 QT Interval:  344 QTC Calculation: 436 R Axis:   55  Text Interpretation: Normal sinus rhythm Possible Left atrial enlargement Nonspecific T wave abnormality When compared with ECG of 13-Apr-2015 13:29, PREVIOUS ECG IS PRESENT Confirmed by Jake Hoffman [phone]) on 05/10/2023 4:07:51 PM    Physical Exam    VS:  BP (!) 162/100 (BP Location: Left Arm, Patient Position: Sitting, Cuff Size: Large)   Pulse 97   Ht 5\' 11"  (1.803 m)   Wt 265 lb 12.8 oz (120.6 kg)   SpO2 98%   BMI 37.07 kg/m  , BMI Body mass index is 37.07 kg/m.     GEN: Well nourished, well developed, in no acute distress. HEENT: normal. Neck: Supple, no JVD, carotid bruits, or masses. Cardiac: RRR, 2/6 holosystolic murmur, no rubs, or gallops. No clubbing, cyanosis, edema.  Radials/DP/PT 2+ and equal bilaterally.  Respiratory:  Respirations regular and unlabored, clear to auscultation bilaterally. GI: Soft, nontender, nondistended, BS + x 4.  No abdominal bruits MS: no deformity or atrophy. Skin: warm and dry, no rash. Neuro:  Strength and sensation are intact. Psych: Normal affect.  EKG Interpretation Date/Time:  Monday May 10 2023 14:45:41  EDT Ventricular Rate:  97 PR Interval:  158 QRS Duration:  86 QT Interval:  344 QTC Calculation: 436 R Axis:   55  Text Interpretation: Normal sinus rhythm Possible Left atrial enlargement Nonspecific T wave abnormality When compared with ECG of 13-Apr-2015 13:29, PREVIOUS ECG IS PRESENT Confirmed by Jake Hoffman [phone]) on 05/10/2023 4:07:51 PM   Lab Results  Component Value Date   WBC 9.8 01/03/2017   HGB 16.7 01/03/2017   HCT 47.6 01/03/2017   MCV 96.0 01/03/2017   PLT 312 01/03/2017   Lab Results  Component Value Date   CREATININE 1.42 (H) 01/03/2017   BUN 19 01/03/2017   NA 136 01/03/2017   K 3.6 01/03/2017   CL 101  01/03/2017   CO2 29 01/03/2017   Lab Results  Component Value Date   ALT 39 01/03/2017   AST 39 01/03/2017   ALKPHOS 90 01/03/2017   BILITOT 0.9 01/03/2017   Lab Results  Component Value Date   CHOL 174 05/05/2013   HDL 47.60 05/05/2013   LDLCALC 120 (H) 05/05/2013   TRIG 32.0 05/05/2013   CHOLHDL 4 05/05/2013    No results found for: "HGBA1C"   Review of Prior Studies EKG Interpretation Date/Time:  Monday May 10 2023 14:45:41 EDT Ventricular Rate:  97 PR Interval:  158 QRS Duration:  86 QT Interval:  344 QTC Calculation: 436 R Axis:   55  Text Interpretation: Normal sinus rhythm Possible Left atrial enlargement Nonspecific T wave abnormality When compared with ECG of 13-Apr-2015 13:29, PREVIOUS ECG IS PRESENT Confirmed by Jake Hoffman [phone]) on 05/10/2023 4:07:51 PM  Echocardiogram 02/18/2022  1. Mild LVOT obstruction peak gradient ~ 13 mmhg at rest, with increase  to ~ 53 mmHg with valsalva. Left ventricular ejection fraction, by  estimation, is 65 to 70%. The left ventricle has normal function. The left  ventricle has no regional wall motion  abnormalities. There is moderate left ventricular hypertrophy. Left  ventricular diastolic parameters are indeterminate.   2. Right ventricular systolic function is normal. The right  ventricular  size is normal. There is normal pulmonary artery systolic pressure.   3. The mitral valve is normal in structure. No evidence of mitral valve  regurgitation.   4. The aortic valve is tricuspid. Aortic valve regurgitation is not  visualized.   5. No significant aortic root or ascending aneurysm.   6. The inferior vena cava is normal in size with greater than 50%  respiratory variability, suggesting right atrial pressure of 3 mmHg.   Findings consistent with hypertrophic obstructive cardiomyopathy.   Assessment & Plan   1.  Hypertrophic obstructive cardiomyopathy: The patient did not follow-up with cardiac MRI as he was out of insurance.  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  I will reorder cardiac MRI.  BMET is ordered today.  He is to take metoprolol as directed.  2.  Hypertension: Noncompliance with medication regimen.  Blood pressure is extremely elevated today.  Blood pressure medications are in his car.  I have advised him to take them as soon as possible and leaving the office.  3.  Medication noncompliance: Lengthy discussion concerning compliance with medications to include kidney failure, CVA, worsening cardiomyopathy, and death associated with uncontrolled BP.  I have advised him to find a regimen which would allow him to remember to take his medicines as directed.  I have no idea whether he is being well-controlled if he is not taking the medications as ordered.  I will see him again in a month to evaluate his response to medications.  May need to add spironolactone if blood pressures not well-controlled.  4.  Upper abdominal pain: I am going to check a CT of the abdomen with and without contrast and aorta evaluation in the setting of HOCM with recurrent discomfort similar to that which he had when he had a ruptured appendix.  I am not hearing any bruits.         Signed, Bettey Mare. Liborio Nixon, ANP, AACC   05/10/2023 4:08 PM      Office  [phone] Fax [phone]  Notice: This dictation was prepared with Dragon dictation along with smaller phrase technology. Any transcriptional errors that result  from this process are unintentional and may not be corrected upon review.

## 2023-05-11 ENCOUNTER — Ambulatory Visit: Payer: Self-pay | Admitting: Adult Health

## 2023-05-11 LAB — BASIC METABOLIC PANEL
BUN/Creatinine Ratio: 11 (ref 9–20)
BUN: 12 mg/dL (ref 6–20)
CO2: 26 mmol/L (ref 20–29)
Calcium: 9.4 mg/dL (ref 8.7–10.2)
Chloride: 103 mmol/L (ref 96–106)
Creatinine, Ser: 1.07 mg/dL (ref 0.76–1.27)
Glucose: 81 mg/dL (ref 70–99)
Potassium: 4.8 mmol/L (ref 3.5–5.2)
Sodium: 143 mmol/L (ref 134–144)
eGFR: 92 mL/min/{1.73_m2} (ref >59)

## 2023-05-12 ENCOUNTER — Telehealth: Payer: Self-pay

## 2023-05-12 NOTE — Telephone Encounter (Addendum)
Called patient regarding results. Left detailed message for patient regarding results. Letter mailed to patient on 05/12/2023.----- Message from [name] sent at 05/11/2023  7:27 AM EDT ----- I have reviewed the labs. They are normal He can proceed with the CT testing.

## 2023-06-07 ENCOUNTER — Ambulatory Visit (HOSPITAL_COMMUNITY)
Admission: RE | Admit: 2023-06-07 | Discharge: 2023-06-07 | Disposition: A | Discharge status: Home / Self Care | Payer: 59 | Source: Ambulatory Visit | Attending: Adult Health | Admitting: Adult Health

## 2023-06-07 DIAGNOSIS — I421 Obstructive hypertrophic cardiomyopathy: Secondary | ICD-10-CM

## 2023-06-07 DIAGNOSIS — I10 Essential (primary) hypertension: Secondary | ICD-10-CM

## 2023-06-07 MED ORDER — IOHEXOL 350 MG/ML SOLN
75.0000 mL | Freq: Once | INTRAVENOUS | Status: AC | PRN
  Administered 2023-06-07: 75 mL via INTRAVENOUS

## 2023-06-10 ENCOUNTER — Telehealth: Payer: Self-pay

## 2023-06-10 NOTE — Telephone Encounter (Addendum)
Called patient regarding result. Left detailed message for patient . Letter mailed to patient on 06/10/23.----- Message from [name] sent at 06/09/2023 10:34 AM EDT ----- I have reviewed the CT of the abdomen no issues with aortic dissection or blockages causing abdominal pain. Has gallstones.  May need to see PCP for more testing such as HIDA scan or GB ultrasound for better views.    KL

## 2023-06-14 NOTE — Progress Notes (Unsigned)
Cardiology Office Note:  .   Date:  06/17/2023  ID:  Carlene Coria, DOB [birth year], MRN [id number] PCP: Bernita Buffy  [facility] Providers Cardiologist:  Maisie Fus, MD  }   History of Present Illness: .   KRISTOPHER ATTWOOD is a 37 y.o. male with history of hypertension, HOCM, echocardiogram 06/23/2022 EF 60-70% LVOT obstruction 13 mmHg at rest and up to 53 mmHg with Valsalva, chordal SAM, IVS 13 mm. 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   Patient has a hx of medication non-compliance.CT of the abdomen revealed gallstones, abdominal wall hernia, no aneurysm.  He was to have a cardiac MRI to evaluate further [facility] which has not been completed yet, but has been rescheduled for July 01, 2023.  He comes today without any complaints of chest pain, shortness of breath, dizziness, or palpitations.  He admits to not eating very healthy most of the time eating out of a vending machine at work.  He does not do a lot of purposeful exercise right now.  He denies exertional symptoms.  He has been medically compliant.  He has occasional abdominal pain which causes him significant discomfort "brings tears to my eyes" and is quite incapacitating.  ROS: As above otherwise negative.  Studies Reviewed: .       Echocardiogram 02/18/2022  1. Mild LVOT obstruction peak gradient ~ 13 mmhg at rest, with increase  to ~ 53 mmHg with valsalva. Left ventricular ejection fraction, by  estimation, is 65 to 70%. The left ventricle has normal function. The left  ventricle has no regional wall motion  abnormalities. There is moderate left ventricular hypertrophy. Left  ventricular diastolic parameters are indeterminate.   2. Right ventricular systolic function is normal. The right ventricular  size is normal. There is normal pulmonary artery systolic pressure.   3. The mitral valve is normal in structure. No evidence of mitral valve  regurgitation.   4. The  aortic valve is tricuspid. Aortic valve regurgitation is not  visualized.   5. No significant aortic root or ascending aneurysm.   6. The inferior vena cava is normal in size with greater than 50%  respiratory variability, suggesting right atrial pressure of 3 mmHg.   Physical Exam:   VS:  BP (!) 134/92 (BP Location: Left Arm, Patient Position: Sitting, Cuff Size: Large)   Pulse 90   Resp 16   Ht 5\' 11"  (1.803 m)   Wt 266 lb 3.2 oz (120.7 kg)   SpO2 98%   BMI 37.13 kg/m    Wt Readings from Last 3 Encounters:  06/17/23 266 lb 3.2 oz (120.7 kg)  05/10/23 265 lb 12.8 oz (120.6 kg)  06/23/22 266 lb 3.7 oz (120.8 kg)    GEN: Well nourished, well developed in no acute distress NECK: No JVD; No carotid bruits CARDIAC: RRR, no murmurs, rubs, gallops RESPIRATORY:  Clear to auscultation without rales, wheezing or rhonchi  ABDOMEN: Soft, non-tender, non-distended EXTREMITIES:  No edema; No deformity   ASSESSMENT AND PLAN: .    Hypertrophic obstructive cardiomyopathy: This was measured as mild per echocardiogram in 2023.  The patient is scheduled for cardiac MRI on July 01, 2023.  He has nausea and vomiting after administration of the eye the dye.  I am going to give him a prescription for Zofran 4 mg which she can take 1 hour prior to the procedure and I given 6 to 8 hours later if  he continues to have nausea and vomiting.  I have asked him to bring a family member friend with him to the MRI in case the Zofran is too sedating not allowing him to drive.  He will follow-up with Dr. Wyline Mood to discuss results.  Continue metoprolol as directed.  2.  Hypertension: On losartan.  Blood pressures have been slightly elevated away from the office especially in diastolic ranges of greater than 100.  Eats a lot of salty foods mostly from a vending machine.  I have advised him to be more mindful of the salted snack foods.  Depending on MRI results may need to change from ARB to alternative antihypertensive  in the setting of HOCM.  3.  Recurrent abdominal pain: CT of the abdomen and pelvis did reveal gallstones.  If these continue to be an issue for him causing discomfort the patient should follow-up with PCP for referral for GI evaluation.        Signed, Bettey Mare. Liborio Nixon, ANP, AACC

## 2023-06-15 ENCOUNTER — Ambulatory Visit: Payer: 59 | Admitting: Adult Health

## 2023-06-17 ENCOUNTER — Encounter: Payer: Self-pay | Admitting: Adult Health

## 2023-06-17 ENCOUNTER — Ambulatory Visit: Payer: 59 | Attending: Adult Health | Admitting: Adult Health

## 2023-06-17 VITALS — BP 134/92 | HR 90 | Resp 16 | Ht 71.0 in | Wt 266.2 lb

## 2023-06-17 DIAGNOSIS — K8020 Calculus of gallbladder without cholecystitis without obstruction: Secondary | ICD-10-CM

## 2023-06-17 DIAGNOSIS — I421 Obstructive hypertrophic cardiomyopathy: Secondary | ICD-10-CM

## 2023-06-17 DIAGNOSIS — I10 Essential (primary) hypertension: Secondary | ICD-10-CM | POA: Diagnosis not present

## 2023-06-17 MED ORDER — ONDANSETRON HCL 4 MG PO TABS: 4.0000 mg | ORAL_TABLET | Freq: Once | ORAL | 0 refills | Status: AC

## 2023-06-17 MED ORDER — METOPROLOL SUCCINATE ER 50 MG PO TB24: 50.0000 mg | ORAL_TABLET | Freq: Once | ORAL | 0 refills | Status: DC

## 2023-06-17 MED ORDER — LOSARTAN POTASSIUM 50 MG PO TABS: 50.0000 mg | ORAL_TABLET | Freq: Every day | ORAL | 3 refills | Status: AC

## 2023-06-17 NOTE — Patient Instructions (Signed)
Medication Instructions:  Take Zofran 4 mg ( Take 1 Tablet 1 Hour Prior To Scan. Take 1 Tablet 6 Hours After Scan if Needed). *If you need a refill on your cardiac medications before your next appointment, please call your pharmacy*   Lab Work: No labs If you have labs (blood work) drawn today and your tests are completely normal, you will receive your results only by: MyChart Message (if you have MyChart) OR A paper copy in the mail If you have any lab test that is abnormal or we need to change your treatment, we will call you to review the results.   Testing/Procedures: No Testing   Follow-Up: At [facility], you and your health needs are our priority.  As part of our continuing mission to provide you with exceptional heart care, we have created designated Provider Care Teams.  These Care Teams include your primary Cardiologist (physician) and Advanced Practice Providers (APPs -  Physician Assistants and Nurse Practitioners) who all work together to provide you with the care you need, when you need it.  We recommend signing up for the patient portal called "MyChart".  Sign up information is provided on this After Visit Summary.  MyChart is used to connect with patients for Virtual Visits (Telemedicine).  Patients are able to view lab/test results, encounter notes, upcoming appointments, etc.  Non-urgent messages can be sent to your provider as well.   To learn more about what you can do with MyChart, go to [email or web address].    Your next appointment:   [facility]  Provider:   Maisie Fus, MD

## 2023-06-29 ENCOUNTER — Encounter (HOSPITAL_COMMUNITY): Payer: Self-pay

## 2023-06-30 ENCOUNTER — Telehealth (HOSPITAL_COMMUNITY): Payer: Self-pay | Admitting: *Deleted

## 2023-06-30 NOTE — Telephone Encounter (Signed)
 Attempted to call patient regarding upcoming cardiac MRI appointment. Left message on voicemail with name and callback number Johney Frame RN Navigator Cardiac Imaging [facility] [phone] Office

## 2023-07-01 ENCOUNTER — Ambulatory Visit (HOSPITAL_COMMUNITY)
Admission: RE | Admit: 2023-07-01 | Discharge: 2023-07-01 | Disposition: A | Discharge status: Home / Self Care | Payer: 59 | Source: Ambulatory Visit | Attending: Adult Health | Admitting: Adult Health

## 2023-07-01 ENCOUNTER — Other Ambulatory Visit: Payer: Self-pay | Admitting: Adult Health

## 2023-07-01 DIAGNOSIS — I421 Obstructive hypertrophic cardiomyopathy: Secondary | ICD-10-CM | POA: Insufficient documentation

## 2023-07-01 DIAGNOSIS — I10 Essential (primary) hypertension: Secondary | ICD-10-CM

## 2023-07-01 MED ORDER — ONDANSETRON HCL 4 MG/2ML IJ SOLN
4.0000 mg | Freq: Once | INTRAMUSCULAR | Status: AC
  Administered 2023-07-01: 4 mg via INTRAVENOUS

## 2023-07-01 MED ORDER — ONDANSETRON HCL 4 MG/2ML IJ SOLN
INTRAMUSCULAR | Status: AC
  Filled 2023-07-01: qty 2

## 2023-07-01 MED ORDER — GADOBUTROL 1 MMOL/ML IV SOLN
10.0000 mL | Freq: Once | INTRAVENOUS | Status: AC | PRN
Start: 2023-07-01 — End: 2023-07-01
  Administered 2023-07-01: 10 mL via INTRAVENOUS

## 2023-07-05 ENCOUNTER — Telehealth: Payer: Self-pay

## 2023-07-05 NOTE — Telephone Encounter (Addendum)
Results viewed by patient via Mychart.----- Message from [name] sent at 07/02/2023  1:51 PM EST ----- I have reviewed his MR report.  No blockages in the tract allowing blood to leave the ventricle on the left.  Heart muscle is still enlarged but not causing any issues with heart pumping function or the heart valves. 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 Just need to have PCP follow this. Not worrisome.

## 2023-08-16 NOTE — Progress Notes (Addendum)
 Cardiology Office Note:    Date:  08/16/2023   ID:  Jake Hoffman, DOB Dec 09, 1985, MRN [id number]  PCP:  Jake Elodia JINNY, PA-C   [facility] Providers Cardiologist:  Jake Ronal BRAVO, MD     Referring MD: Jake Hoffman, *   No chief complaint on file. Establish Care  History of Present Illness:    Jake Hoffman is a 38 y.o. male with a hx of HTN, appendectomy, referral to re-establish care  Saw Dr. Alveta in 2014. At that time he was on lisinopril -HCTz 10-12.5 mg daily. Blood pressure was normal. BPS typically ok. He feels a pressure in his chest with minimal activity. He noted this with a laundry basket. This has been going for 2 weeks. Can lift heavy things and has no symptoms. No DM2.  Parents have HTN. No premature CAD. He is having a lot of stress.   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 No family hx of SCD. No syncope  Interval hx 08/17/2023 Mr. Jake Hoffman comes in today for follow-up appointment.  We discussed the findings of his echocardiogram as well as his cardiac MRI.  His cardiac MRI definitively did not suggest hypertrophic cardiomyopathy.  Otherwise we discussed his blood pressure and that it was elevated today.  He does not routinely check his blood pressures.  He was taking metoprolol  as well as losartan  50 mg daily.   Past Medical History:  Diagnosis Date   Hernia of abdominal cavity    Hypertension    LVH (left ventricular hypertrophy)     Past Surgical History:  Procedure Laterality Date   LAPAROSCOPIC APPENDECTOMY N/A 04/13/2015   Procedure: APPENDECTOMY LAPAROSCOPIC;  Surgeon: Jake Spinner, MD;  Location: [facility];  Service: General;  Laterality: N/A;    Current Medications: No outpatient medications have been marked as taking for the 08/17/23 encounter (Appointment) with Jake Ronal BRAVO, MD.     Allergies:   Patient has no known allergies.   Social History   Socioeconomic History    Marital status: Married    Spouse name: Not on file   Number of children: Not on file   Years of education: Not on file   Highest education level: Not on file  Occupational History   Not on file  Tobacco Use   Smoking status: Never   Smokeless tobacco: Not on file  Vaping Use   Vaping status: Never Used  Substance and Sexual Activity   Alcohol use: Yes    Alcohol/week: 0.0 standard drinks of alcohol    Comment: socially   Drug use: No   Sexual activity: Not on file  Other Topics Concern   Not on file  Social History Narrative   Married    One children and they are pregnant with their second   Production Designer, Theatre/television/film of moving company    Social Drivers of Corporate Investment Banker Strain: Not on file  Food Insecurity: Low Risk  (08/03/2023)   Received from [facility]   Hunger Vital Sign    Worried About Running Out of Food in the Last Year: Never true    Ran Out of Food in the Last Year: Never true  Transportation Needs: No Transportation Needs (08/03/2023)   Received from [facility]    In the past 12 months, has lack of reliable transportation kept you from medical appointments, meetings, work or from getting things needed for daily living? :  No  Physical Activity: Not on file  Stress: Not on file  Social Connections: Not on file     Family History: The patient's family history includes Arthritis in his mother; Cancer in his father; Hyperlipidemia in his mother; Hypertension in his father and mother.  ROS:   Please see the history of present illness.     All other systems reviewed and are negative.  EKGs/Labs/Other Studies Reviewed:    The following studies were reviewed today:   EKG:  EKG is  ordered today.  The ekg ordered today demonstrates   5/2-NSR, TWI diffusely, more prominent anterolaterally. Unchanged from prior (benign)  EKG Interpretation Date/Time:  Tuesday August 17 2023 08:56:15 EST Ventricular Rate:  94 PR Interval:  156 QRS  Duration:  88 QT Interval:  342 QTC Calculation: 427 R Axis:   54  Text Interpretation: Normal sinus rhythm with sinus arrhythmia Nonspecific T wave abnormality When compared with ECG of 10-May-2023 14:45, Nonspecific T wave abnormality now evident in Inferior leads Confirmed by Jake Hoffman ([id number]) on 08/17/2023 9:11:15 AM   Cardiology Studies 06/23/2022: EF 60-70%, LOVT obstruction 13 mmHg at rest up to 53 mmHg with valsalva, IVS 13 mm.  ?Chordal SAM  Recent Labs: 05/10/2023: BUN 12; Creatinine, Ser 1.07; Potassium 4.8; Sodium 143  Recent Lipid Panel    Component Value Date/Time   CHOL 174 05/05/2013 0847   TRIG 32.0 05/05/2013 0847   HDL 47.60 05/05/2013 0847   CHOLHDL 4 05/05/2013 0847   VLDL 6.4 05/05/2013 0847   LDLCALC 120 (H) 05/05/2013 0847     Risk Assessment/Calculations:           Physical Exam:    VS:  Vitals:   08/17/23 0848  BP: (!) 150/102  Pulse: 94  SpO2: 94%      Wt Readings from Last 3 Encounters:  06/17/23 266 lb 3.2 oz (120.7 kg)  05/10/23 265 lb 12.8 oz (120.6 kg)  06/23/22 266 lb 3.7 oz (120.8 kg)     GEN:  Well nourished, well developed in no acute distress HEENT: Normal NECK: No JVD; No carotid bruits LYMPHATICS: No lymphadenopathy CARDIAC: RRR, no murmurs, rubs, gallops RESPIRATORY:  Clear to auscultation without rales, wheezing or rhonchi  ABDOMEN: Soft, non-tender, non-distended MUSCULOSKELETAL:  No edema; No deformity  SKIN: Warm and dry NEUROLOGIC:  Alert and oriented x 3 PSYCHIATRIC:  Normal affect   ASSESSMENT:    Mild LVH: no significant IVS wall thickness. No high risk features for SCD. We discussed this.  He underwent a cardiac MRI in November 2024 that was not suggestive of HOCM.   I ordered a monitor that was not completed, but w/o HCM this is not needed.   HTN, not well controlled  -stopped norvasc 5 mg daily unclear why stopped -Stopped metoprolol  without HCM, not great for blood pressure control -Continue losartan   50 mg daily  -will add chlorthalidone  25 mg.  We discussed keeping a log at home for 1 week and let us  know if BP is not at goal <130/80 mmHg.  Plan is to uptitrate his antihypertensives as needed   PLAN:    In order of problems listed above:   Start chlorthalidone  25 mg daily Follow up in 6 months with an APP     No orders of the defined types were placed in this encounter.  No orders of the defined types were placed in this encounter.   There are no Patient Instructions on file for this visit.  Signed, Jake Ronal BRAVO, MD  08/16/2023 4:44 PM    [facility]

## 2023-08-17 ENCOUNTER — Encounter: Payer: Self-pay | Admitting: Internal Medicine

## 2023-08-17 ENCOUNTER — Ambulatory Visit: Payer: 59 | Attending: Internal Medicine | Admitting: Internal Medicine

## 2023-08-17 VITALS — BP 150/102 | HR 94 | Ht 71.0 in | Wt 267.2 lb

## 2023-08-17 DIAGNOSIS — I10 Essential (primary) hypertension: Secondary | ICD-10-CM | POA: Diagnosis not present

## 2023-08-17 MED ORDER — CHLORTHALIDONE 25 MG PO TABS: 25.0000 mg | ORAL_TABLET | Freq: Every day | ORAL | 3 refills | Status: AC

## 2023-08-17 NOTE — Patient Instructions (Signed)
 Medication Instructions:  Metoprolol  removed from med list. Start taking chlorthalidone  25 mg by mouth daily. New script sent. *If you need a refill on your cardiac medications before your next appointment, please call your pharmacy*   Follow-Up: At [facility], you and your health needs are our priority.  As part of our continuing mission to provide you with exceptional heart care, we have created designated Provider Care Teams.  These Care Teams include your primary Cardiologist (physician) and Advanced Practice Providers (APPs -  Physician Assistants and Nurse Practitioners) who all work together to provide you with the care you need, when you need it.  We recommend signing up for the patient portal called MyChart.  Sign up information is provided on this After Visit Summary.  MyChart is used to connect with patients for Virtual Visits (Telemedicine).  Patients are able to view lab/test results, encounter notes, upcoming appointments, etc.  Non-urgent messages can be sent to your provider as well.   To learn more about what you can do with MyChart, go to [email or web address].    Your next appointment:   6 month(s)  Provider:   First available APP      Other Instructions Take blood pressure once daily and record on BP log sheet provided by the office nurse. Please call office is results are greater than 130/80. Please drop off log sheet or enter into MyChart as a message for the provider to review.

## 2023-08-22 ENCOUNTER — Encounter (HOSPITAL_COMMUNITY): Payer: Self-pay | Admitting: Emergency Medicine

## 2023-08-22 ENCOUNTER — Ambulatory Visit (HOSPITAL_COMMUNITY)
Admission: EM | Admit: 2023-08-22 | Discharge: 2023-08-22 | Disposition: A | Discharge status: Home / Self Care | Payer: 59 | Attending: Emergency Medicine | Admitting: Emergency Medicine

## 2023-08-22 ENCOUNTER — Other Ambulatory Visit: Payer: Self-pay

## 2023-08-22 DIAGNOSIS — J039 Acute tonsillitis, unspecified: Secondary | ICD-10-CM | POA: Diagnosis not present

## 2023-08-22 LAB — POCT RAPID STREP A (OFFICE): Rapid Strep A Screen: NEGATIVE

## 2023-08-22 MED ORDER — METHYLPREDNISOLONE SODIUM SUCC 125 MG IJ SOLR
60.0000 mg | Freq: Once | INTRAMUSCULAR | Status: AC
  Administered 2023-08-22: 60 mg via INTRAMUSCULAR

## 2023-08-22 MED ORDER — AMOXICILLIN 500 MG PO CAPS: 500.0000 mg | ORAL_CAPSULE | Freq: Two times a day (BID) | ORAL | 0 refills | Status: AC

## 2023-08-22 MED ORDER — METHYLPREDNISOLONE SODIUM SUCC 125 MG IJ SOLR
INTRAMUSCULAR | Status: AC
  Filled 2023-08-22: qty 2

## 2023-08-22 MED ORDER — IBUPROFEN 600 MG PO TABS: 600.0000 mg | ORAL_TABLET | Freq: Four times a day (QID) | ORAL | 0 refills | Status: AC | PRN

## 2023-08-22 NOTE — ED Provider Notes (Signed)
 [facility]    CSN: [id number] Arrival date & time: 08/22/23  1515     History   Chief Complaint Chief Complaint  Patient presents with   Sore Throat    HPI Jake Hoffman is a 38 y.o. male.  2-3 day history of sore throat, runny nose, headache 10/10 pain with swallowing  No known fever or cough [facility]; negative flu, covid, strep. Trying ibuprofen , lidocaine  gargle, and cold&flu without relief Unknown sick contacts   Past Medical History:  Diagnosis Date   Hernia of abdominal cavity    Hypertension    LVH (left ventricular hypertrophy)     Patient Active Problem List   Diagnosis Date Noted   HOCM (hypertrophic obstructive cardiomyopathy) (HCC) 02/18/2022   Acute appendicitis 04/13/2015   Hypertriglyceridemia 01/17/2014   HTN (hypertension) 03/06/2013    Past Surgical History:  Procedure Laterality Date   LAPAROSCOPIC APPENDECTOMY N/A 04/13/2015   Procedure: APPENDECTOMY LAPAROSCOPIC;  Surgeon: Krystal Spinner, MD;  Location: [facility];  Service: General;  Laterality: N/A;       Home Medications    Prior to Admission medications   Medication Sig Start Date End Date Taking? Authorizing Provider  amoxicillin  (AMOXIL ) 500 MG capsule Take 1 capsule (500 mg total) by mouth 2 (two) times daily for 10 days. 08/22/23 09/01/23 Yes Lorelai Huyser, [place], PA-C  ibuprofen  (ADVIL ) 600 MG tablet Take 1 tablet (600 mg total) by mouth every 6 (six) hours as needed. 08/22/23  Yes Felipe Cabell, PA-C  ipratropium (ATROVENT) 0.06 % nasal spray Place into both nostrils. 08/21/23  Yes [provider]  lidocaine  (XYLOCAINE ) 2 % solution  08/21/23  Yes [provider]  acetaminophen  (TYLENOL ) 500 MG tablet Take 1,000 mg by mouth every 6 (six) hours as needed.    [provider]  chlorthalidone  (HYGROTON ) 25 MG tablet Take 1 tablet (25 mg total) by mouth daily. 08/17/23 11/15/23  Alvan Ronal BRAVO, MD  losartan  (COZAAR ) 50 MG tablet Take 1  tablet (50 mg total) by mouth daily. 06/17/23   Jerilynn Lamarr HERO, NP  traZODone (DESYREL) 50 MG tablet Take 100 mg by mouth at bedtime as needed.    [provider]    Family History Family History  Problem Relation Age of Onset   Hyperlipidemia Mother    Hypertension Mother    Arthritis Mother    Hypertension Father    Cancer Father        unknown ? lung    Social History Social History   Tobacco Use   Smoking status: Never  Vaping Use   Vaping status: Never Used  Substance Use Topics   Alcohol use: Yes    Alcohol/week: 0.0 standard drinks of alcohol    Comment: socially   Drug use: No     Allergies   Patient has no known allergies.   Review of Systems Review of Systems Per HPI  Physical Exam Triage Vital Signs ED Triage Vitals  Encounter Vitals Group     BP 08/22/23 1605 (!) 145/92     Systolic BP Percentile --      Diastolic BP Percentile --      Pulse Rate 08/22/23 1605 (!) 109     Resp 08/22/23 1605 18     Temp 08/22/23 1605 98.9 F (37.2 C)     Temp Source 08/22/23 1605 Oral     SpO2 08/22/23 1605 95 %     Weight --  Height --      Head Circumference --      Peak Flow --      Pain Score 08/22/23 1602 10     Pain Loc --      Pain Education --      Exclude from Growth Chart --    No data found.  Updated Vital Signs BP (!) 145/92 (BP Location: Left Arm) Comment (BP Location): large cuff  Pulse 100   Temp 98.8 F (37.1 C)   Resp 18   SpO2 96%    Physical Exam Vitals and nursing note reviewed.  Constitutional:      Appearance: He is not diaphoretic.  HENT:     Right Ear: Tympanic membrane and ear canal normal.     Left Ear: Tympanic membrane and ear canal normal.     Nose: No rhinorrhea.     Mouth/Throat:     Mouth: Mucous membranes are moist.     Pharynx: Oropharynx is clear. Posterior oropharyngeal erythema present. No oropharyngeal exudate or uvula swelling.     Tonsils: Tonsillar exudate present. No tonsillar  abscesses. 3+ on the right. 3+ on the left.     Comments: Muffled phonation. Tolerating secretions  Eyes:     Conjunctiva/sclera: Conjunctivae normal.  Cardiovascular:     Rate and Rhythm: Normal rate and regular rhythm.     Heart sounds: Normal heart sounds.  Pulmonary:     Effort: Pulmonary effort is normal.     Breath sounds: Normal breath sounds.  Musculoskeletal:     Cervical back: Normal range of motion.  Lymphadenopathy:     Cervical: No cervical adenopathy.  Skin:    General: Skin is warm and dry.  Neurological:     Mental Status: He is alert and oriented to person, place, and time.     UC Treatments / Results  Labs (all labs ordered are listed, but only abnormal results are displayed) Labs Reviewed  CULTURE, GROUP A STREP [facility])  POCT RAPID STREP A (OFFICE)    EKG   Radiology No results found.  Procedures Procedures (including critical care time)  Medications Ordered in UC Medications  methylPREDNISolone  sodium succinate (SOLU-MEDROL ) 125 mg/2 mL injection 60 mg (has no administration in time range)    Initial Impression / Assessment and Plan / UC Course  I have reviewed the triage vital signs and the nursing notes.  Pertinent labs & imaging results that were available during my care of the patient were reviewed by me and considered in my medical decision making (see chart for details).  Currently afebrile. Muffled voice but tolerating secretions, no abscess noted on exam. Tonsils 3+ bilat.  Rapid strep negative, culture pending With tonsillar swelling and exudate, treat for tonsillitis with amox BID x 10 days. Decadron unavailable in clinic currently. IM solumedrol given for swelling/pain. Recommend continue ibuprofen  and lido gargle. Strict return and ED precautions  Final Clinical Impressions(s) / UC Diagnoses   Final diagnoses:  Tonsillitis     Discharge Instructions      The steroid injection should start to work in about 30 minutes to  reduce swelling  Please take the antibiotic for the full 10 days. It's important to finish all 10 days even when you are feeling better. Otherwise the infection can come back worse. Take with food to avoid upset stomach. Make sure to change out your toothbrush! Continue other symptomatic care as needed for pain. Please go to the emergency department if symptoms worsen.  ED Prescriptions     Medication Sig Dispense Auth. Provider   amoxicillin  (AMOXIL ) 500 MG capsule Take 1 capsule (500 mg total) by mouth 2 (two) times daily for 10 days. 20 capsule Sammy Cassar, PA-C   ibuprofen  (ADVIL ) 600 MG tablet Take 1 tablet (600 mg total) by mouth every 6 (six) hours as needed. 30 tablet Elzina Devera, [place], PA-C      PDMP not reviewed this encounter.   Amahia Madonia, Asberry RIGGERS 08/22/23 1722

## 2023-08-22 NOTE — ED Notes (Signed)
 Reviewed work note content

## 2023-08-22 NOTE — Discharge Instructions (Addendum)
 The steroid injection should start to work in about 30 minutes to reduce swelling  Please take the antibiotic for the full 10 days. It's important to finish all 10 days even when you are feeling better. Otherwise the infection can come back worse. Take with food to avoid upset stomach. Make sure to change out your toothbrush! Continue other symptomatic care as needed for pain. Please go to the emergency department if symptoms worsen.

## 2023-08-22 NOTE — ED Triage Notes (Addendum)
 Friday had a sore throat.  Since then, symptoms have worsened.  Patient has sore throat, aches runny nose.  [facility]  Tested negative for covid, flu , and strep.  Was provided nasal spray and lidocaine  for throat, and encouraged over the counter supportive medications

## 2023-08-25 LAB — CULTURE, GROUP A STREP (THRC)

## 2023-10-21 NOTE — Progress Notes (Signed)
 Dr. Servando Salina and Leavy Cella,  Avyukt has been identified as a patient who may benefit from health coaching to develop healthy eating habits.  Please refer to [id number] or search for Care Navigation.   Renaee Munda, MS, ERHD, [facility]  Care Guide, Health & Wellness Coach [address] [place] [address] Telephone: [phone] Email: [email or web address]

## 2023-10-25 ENCOUNTER — Ambulatory Visit: Payer: 59 | Attending: Cardiology | Admitting: Cardiology
